# Patient Record
Sex: Female | Born: 1955
Health system: Southern US, Community
[De-identification: ages and names within clinical notes are randomized; demographics above are authoritative.]

## PROBLEM LIST (undated history)

## (undated) DIAGNOSIS — C50919 Malignant neoplasm of unspecified site of unspecified female breast: Secondary | ICD-10-CM

## (undated) DIAGNOSIS — G43909 Migraine, unspecified, not intractable, without status migrainosus: Secondary | ICD-10-CM

## (undated) HISTORY — PX: AUGMENTATION MAMMAPLASTY: SUR837

## (undated) HISTORY — PX: APPENDECTOMY: SHX54

## (undated) HISTORY — PX: BREAST LUMPECTOMY: SHX2

## (undated) HISTORY — DX: Malignant neoplasm of unspecified site of unspecified female breast: C50.919

## (undated) HISTORY — PX: BLADDER SUSPENSION: SHX72

## (undated) HISTORY — PX: NOSE SURGERY: SHX723

## (undated) HISTORY — DX: Migraine, unspecified, not intractable, without status migrainosus: G43.909

---

## 1998-01-29 ENCOUNTER — Emergency Department (HOSPITAL_COMMUNITY): Admission: EM | Admit: 1998-01-29 | Discharge: 1998-01-30 | Payer: Self-pay | Admitting: Emergency Medicine

## 1999-01-06 ENCOUNTER — Other Ambulatory Visit: Admission: RE | Admit: 1999-01-06 | Discharge: 1999-01-06 | Payer: Self-pay | Admitting: Gynecology

## 2000-10-13 ENCOUNTER — Other Ambulatory Visit: Admission: RE | Admit: 2000-10-13 | Discharge: 2000-10-13 | Payer: Self-pay | Admitting: Gynecology

## 2002-04-09 ENCOUNTER — Other Ambulatory Visit: Admission: RE | Admit: 2002-04-09 | Discharge: 2002-04-09 | Payer: Self-pay | Admitting: Gynecology

## 2003-08-14 ENCOUNTER — Other Ambulatory Visit: Admission: RE | Admit: 2003-08-14 | Discharge: 2003-08-14 | Payer: Self-pay | Admitting: Gynecology

## 2003-10-25 ENCOUNTER — Encounter (INDEPENDENT_AMBULATORY_CARE_PROVIDER_SITE_OTHER): Payer: Self-pay | Admitting: *Deleted

## 2003-10-25 ENCOUNTER — Ambulatory Visit (HOSPITAL_BASED_OUTPATIENT_CLINIC_OR_DEPARTMENT_OTHER): Admission: RE | Admit: 2003-10-25 | Discharge: 2003-10-25 | Payer: Self-pay | Admitting: Urology

## 2003-10-25 ENCOUNTER — Ambulatory Visit (HOSPITAL_COMMUNITY): Admission: RE | Admit: 2003-10-25 | Discharge: 2003-10-25 | Payer: Self-pay | Admitting: Urology

## 2005-02-02 ENCOUNTER — Emergency Department (HOSPITAL_COMMUNITY): Admission: EM | Admit: 2005-02-02 | Discharge: 2005-02-02 | Payer: Self-pay | Admitting: Emergency Medicine

## 2005-03-27 ENCOUNTER — Ambulatory Visit (HOSPITAL_COMMUNITY): Admission: RE | Admit: 2005-03-27 | Discharge: 2005-03-27 | Payer: Self-pay | Admitting: Family Medicine

## 2006-02-16 ENCOUNTER — Other Ambulatory Visit: Admission: RE | Admit: 2006-02-16 | Discharge: 2006-02-16 | Payer: Self-pay | Admitting: Gynecology

## 2006-08-23 HISTORY — PX: CERVICAL BIOPSY  W/ LOOP ELECTRODE EXCISION: SUR135

## 2007-02-27 ENCOUNTER — Other Ambulatory Visit: Admission: RE | Admit: 2007-02-27 | Discharge: 2007-02-27 | Payer: Self-pay | Admitting: Gynecology

## 2007-10-20 ENCOUNTER — Other Ambulatory Visit: Admission: RE | Admit: 2007-10-20 | Discharge: 2007-10-20 | Payer: Self-pay | Admitting: Gynecology

## 2008-01-25 ENCOUNTER — Observation Stay (HOSPITAL_COMMUNITY): Admission: EM | Admit: 2008-01-25 | Discharge: 2008-01-26 | Payer: Self-pay | Admitting: Orthopedic Surgery

## 2008-01-26 ENCOUNTER — Ambulatory Visit: Payer: Self-pay | Admitting: Vascular Surgery

## 2008-01-26 ENCOUNTER — Encounter (INDEPENDENT_AMBULATORY_CARE_PROVIDER_SITE_OTHER): Payer: Self-pay | Admitting: Internal Medicine

## 2008-02-07 ENCOUNTER — Ambulatory Visit (HOSPITAL_COMMUNITY): Admission: RE | Admit: 2008-02-07 | Discharge: 2008-02-07 | Payer: Self-pay | Admitting: Family Medicine

## 2008-03-26 ENCOUNTER — Other Ambulatory Visit: Admission: RE | Admit: 2008-03-26 | Discharge: 2008-03-26 | Payer: Self-pay | Admitting: Gynecology

## 2009-04-10 ENCOUNTER — Ambulatory Visit: Payer: Self-pay | Admitting: Gynecology

## 2009-04-10 ENCOUNTER — Other Ambulatory Visit: Admission: RE | Admit: 2009-04-10 | Discharge: 2009-04-10 | Payer: Self-pay | Admitting: Gynecology

## 2009-04-10 ENCOUNTER — Encounter: Payer: Self-pay | Admitting: Gynecology

## 2009-09-16 ENCOUNTER — Ambulatory Visit: Payer: Self-pay | Admitting: Gynecology

## 2009-10-29 ENCOUNTER — Ambulatory Visit: Payer: Self-pay | Admitting: Gynecology

## 2010-03-27 ENCOUNTER — Ambulatory Visit: Payer: Self-pay | Admitting: Gynecology
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
## 2011-01-05 NOTE — Procedures (Signed)
EEG NUMBER:  [id number].   REFERRING PHYSICIAN:  Dr. Ladene Artist.   This is a 55 year old woman with an episode of syncope, passed out at  home in the bathroom, unclear if it was seizure or syncope.  The patient  was found by husband, gasping, limb, eyes rolled back, and head not  responding to stimuli, regaining consciousness, loss of consciousness  was estimated 15-20 minutes.   MEDICATIONS LISTED:  K-Dur, Protonix, aspirin, and Lovenox.   This was a routine 17-channel EEG with one channel devoted to EKG,  utilizing [employer] 10/20 lead placement system.  The patient was  described as awake and asleep throughout the study clinically.  Electrographically, the patient appeared to be in waking, drowsy, and  light natural sleep states.  While awake the background consisted of a  poorly organized, fairly well-developed and well-modulated 10 Hz alpha  activity predominant in the posterior head regions and reactive to eye  opening.  During drowsiness, there was attenuation of the background  with decreased frequency and amplitude with onset of sleep, some central  sharp activity was seen, beta sleep spindles were noted and some K-  complexes were noted.  No interhemispheric asymmetry is identified.  No  definite epileptiform discharges were seen.  Hyperventilation was  performed and did not produce any significant change in the background  activity.  Photic stimulation did produce some occipital photic driving  at several flash frequencies.  The EKG monitor reveals relatively  regular rhythm with a rate of 72 beats per minute.   CONCLUSION:  Essentially normal awake, drowsy, and light natural sleep  EEG without seizure activity or focal abnormalities seen during the  course of today's recording.  Clinical correlation is recommended.      Catherine A. Orlin Hilding, M.D.  Electronically Signed     ZOX:WRUE  D:  01/26/2008 16:22:45  T:  01/27/2008 02:45:47  Job #:  [id number]
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
## 2011-01-08 NOTE — Op Note (Signed)
Kayla Rogers, Kayla Rogers                           ACCOUNT NO.:  [id number]   MEDICAL RECORD NO.:  [id number]                   PATIENT TYPE:  AMB   LOCATION:  NESC                                 FACILITY:  [facility]   PHYSICIAN:  Kayla Rogers, M.D.         DATE OF BIRTH:  11-28-1955   DATE OF PROCEDURE:  10/25/2003  DATE OF DISCHARGE:                                 OPERATIVE REPORT   PREOPERATIVE DIAGNOSIS:  Stress urinary incontinence.   POSTOPERATIVE DIAGNOSIS:  Stress urinary incontinence.   OPERATION:  Mentor transvaginal pubovaginal sling.   SURGEON:  Kayla Rogers, M.D.   ANESTHESIA:  General endotracheal.   PREPARATION:  After appropriate preanesthesia, the patient is brought to the  operating room and placed on the operating table in the dorsal supine  position where general LMA anesthesia was introduced.  She was then re-  placed dorsal lithotomy position.  Pubis was prepped with Betadine solution  and draped in the usual fashion.   DESCRIPTION OF PROCEDURE:  Posterior weighted speculum was placed, and 3 mL  of Marcaine 0.5% plain was injected over the urethra.  A 1.5 cm incision was  made over the urethra and subcutaneous tissue dissected bilaterally to the  level of retropubic fascia.  Two separate stab wounds were then made 5 cm  lateral to the clitoris.  Mentor transobturator pubovaginal sling was placed  in a standard fashion.  The tape was tensioned correctly.  Cystoscopy  revealed no evidence of any bladder involvement.  With a right-angle clamp  behind the tape, the wings were cut subcutaneously.  Wound was closed in two  layers with 3-0 Vicryl suture.  Irrigation was accomplished.  The patient  then underwent axillary node biopsy per Dr. Wenda Low, and this will be  dictated separately.  She had a B&O suppository and IV Toradol.  She was  awakened and taken to the recovery room after Dr. Ermalene Searing procedure.             Kayla Rogers, M.D.    SIT/MEDQ  D:  10/25/2003  T:  10/25/2003  Job:  [id number]

## 2011-01-08 NOTE — Op Note (Signed)
[name], Kayla Rogers                           ACCOUNT NO.:  [id number]   MEDICAL RECORD NO.:  [id number]                   PATIENT TYPE:  AMB   LOCATION:  [facility]                                 FACILITY:  [facility]   PHYSICIAN:  Thornton Park. Daphine Deutscher, M.D.             DATE OF BIRTH:  [birth year]   DATE OF PROCEDURE:  10/25/2003  DATE OF DISCHARGE:                                 OPERATIVE REPORT   PREOPERATIVE DIAGNOSIS:  Left axillary node palpable.   PROCEDURE:  Left axillary node biopsy.   SURGEON:  Thornton Park. Daphine Deutscher, M.D.   ANESTHESIA:  General.   DESCRIPTION OF PROCEDURE:  After [name] had undergone the sling procedure  for stress incontinence by Dr. Patsi Sears, I came in after she was already  asleep and palpated the area in her left axilla and the rubbery node in the  area previously described in my note beneath her left pectoral muscle.  I  made a small incision in the skin lines within the hair line and carried  this down deep through the fascia.  I then palpated the rubbery nodule and  then dissected it from the surrounding tissue.  I stayed down right on the  node, and I used small clips to isolate the vascular and lymphatics applied  to this node.  I did not need to cut any structure that looked like a nerve  or any vascular structures and wanted to do that to minimize postoperative  dysesthesias which I had informed her about as a potential risk for this  procedure.  The node was then delivered en toto and sent for touch preps and  node exam.  The wound was then closed with 4-0 Vicryl with Benzoin and Steri-  Strips.  The patient tolerated the procedure well and was taken to the  recovery room in satisfactory condition.                                               Thornton Park Daphine Deutscher, M.D.    MBM/MEDQ  D:  10/25/2003  T:  10/25/2003  Job:  [id number]   cc:   Lynelle Smoke I. Patsi Sears, M.D.  [address]  [place]  [place] [address]  Fax: [phone]   Nadyne Coombes.  Audie Box, M.D.  [address]  [place]  [place] [address]  Fax: [phone]   [facility]

## 2011-01-14 ENCOUNTER — Other Ambulatory Visit (HOSPITAL_COMMUNITY)
Admission: RE | Admit: 2011-01-14 | Discharge: 2011-01-14 | Disposition: A | Discharge status: Home / Self Care | Payer: BC Managed Care – PPO | Source: Ambulatory Visit | Attending: Gynecology | Admitting: Gynecology

## 2011-01-14 ENCOUNTER — Encounter (INDEPENDENT_AMBULATORY_CARE_PROVIDER_SITE_OTHER): Payer: BC Managed Care – PPO | Admitting: Gynecology

## 2011-01-14 ENCOUNTER — Other Ambulatory Visit: Payer: Self-pay | Admitting: Gynecology

## 2011-01-14 DIAGNOSIS — N926 Irregular menstruation, unspecified: Secondary | ICD-10-CM

## 2011-01-14 DIAGNOSIS — Z833 Family history of diabetes mellitus: Secondary | ICD-10-CM

## 2011-01-14 DIAGNOSIS — Z13220 Encounter for screening for lipoid disorders: Secondary | ICD-10-CM

## 2011-01-14 DIAGNOSIS — Z124 Encounter for screening for malignant neoplasm of cervix: Secondary | ICD-10-CM | POA: Insufficient documentation

## 2011-01-14 DIAGNOSIS — Z01419 Encounter for gynecological examination (general) (routine) without abnormal findings: Secondary | ICD-10-CM

## 2011-03-26 ENCOUNTER — Telehealth: Payer: Self-pay | Admitting: *Deleted

## 2011-03-26 DIAGNOSIS — N926 Irregular menstruation, unspecified: Secondary | ICD-10-CM

## 2011-03-26 MED ORDER — MEDROXYPROGESTERONE ACETATE 10 MG PO TABS: 10.0000 mg | ORAL_TABLET | Freq: Two times a day (BID) | ORAL | Status: DC

## 2011-03-26 NOTE — Telephone Encounter (Signed)
PT INFORMED WITH THE BELOW NOTE AND TO FOLLOW UP IF BLEEDING CONTINUES.

## 2011-03-26 NOTE — Telephone Encounter (Signed)
Tell patient we will withdraw her on Provera 10 mg twice a day x5 days assuming period and his and we'll watch if continued bleeding been present for office visit

## 2011-03-26 NOTE — Telephone Encounter (Signed)
PT CALLING C/O BLEEDING SINCE 03/10/11 LMP. PT WAS TOLD TO FOLLOW UP RE: IRREGULAR MENSES. PLEASE ADVISE.

## 2011-05-20 LAB — LIPID PANEL
Cholesterol: 129
LDL Cholesterol: 74
Total CHOL/HDL Ratio: 2.7

## 2011-05-20 LAB — URINE CULTURE

## 2011-05-20 LAB — PROTIME-INR
INR: 1
Prothrombin Time: 12.7
Prothrombin Time: 13.4

## 2011-05-20 LAB — DIFFERENTIAL
Basophils Absolute: 0.1
Basophils Relative: 2 — ABNORMAL HIGH
Lymphs Abs: 2.2
Neutro Abs: 5.9
Neutrophils Relative %: 66

## 2011-05-20 LAB — POCT I-STAT, CHEM 8
Chloride: 107
HCT: 40
Hemoglobin: 13.6
Potassium: 3.3 — ABNORMAL LOW
TCO2: 18

## 2011-05-20 LAB — SEDIMENTATION RATE: Sed Rate: 5

## 2011-05-20 LAB — COMPREHENSIVE METABOLIC PANEL
ALT: 10
Alkaline Phosphatase: 37 — ABNORMAL LOW
BUN: 9
CO2: 24
Chloride: 113 — ABNORMAL HIGH
GFR calc non Af Amer: >60
Glucose, Bld: 90
Potassium: 3.6
Sodium: 141
Total Bilirubin: 0.7
Total Protein: 5.9 — ABNORMAL LOW

## 2011-05-20 LAB — CARDIAC PANEL(CRET KIN+CKTOT+MB+TROPI)
CK, MB: 0.9
Relative Index: INVALID
Troponin I: 0.03

## 2011-05-20 LAB — CBC
HCT: 36
Hemoglobin: 12.3
MCV: 91.2
Platelets: 278
Platelets: 342
RDW: 12.8
WBC: 6.2
WBC: 9

## 2011-05-20 LAB — RAPID URINE DRUG SCREEN, HOSP PERFORMED
Barbiturates: NOT DETECTED
Benzodiazepines: NOT DETECTED
Cocaine: NOT DETECTED

## 2011-05-20 LAB — APTT
aPTT: 33
aPTT: 34

## 2011-05-20 LAB — HEMOGLOBIN A1C: Mean Plasma Glucose: 104

## 2011-05-20 LAB — CK TOTAL AND CKMB (NOT AT ARMC)
CK, MB: 1.2
Relative Index: INVALID

## 2011-05-20 LAB — HEPATIC FUNCTION PANEL
ALT: 10
AST: 16

## 2011-05-20 LAB — RPR: RPR Ser Ql: NONREACTIVE

## 2011-05-20 LAB — POCT CARDIAC MARKERS
CKMB, poc: <1 — ABNORMAL LOW
Myoglobin, poc: 77.1
Operator id: 270651

## 2011-05-20 LAB — VITAMIN B12: Vitamin B-12: 281 (ref 211–911)

## 2011-05-20 LAB — PHOSPHORUS: Phosphorus: 3.3

## 2011-05-20 LAB — TRICYCLICS SCREEN, URINE: TCA Scrn: NOT DETECTED

## 2011-05-20 LAB — URINALYSIS, ROUTINE W REFLEX MICROSCOPIC
Bilirubin Urine: NEGATIVE
Glucose, UA: NEGATIVE
Hgb urine dipstick: NEGATIVE
Protein, ur: NEGATIVE
Urobilinogen, UA: 0.2

## 2011-05-20 LAB — HOMOCYSTEINE: Homocysteine: 11.4

## 2011-05-20 LAB — ETHANOL: Alcohol, Ethyl (B): <5

## 2011-05-20 LAB — D-DIMER, QUANTITATIVE: D-Dimer, Quant: 0.33

## 2011-05-20 LAB — LACTIC ACID, PLASMA: Lactic Acid, Venous: 0.6

## 2011-05-20 LAB — SALICYLATE LEVEL: Salicylate Lvl: <4

## 2011-06-01 ENCOUNTER — Ambulatory Visit (INDEPENDENT_AMBULATORY_CARE_PROVIDER_SITE_OTHER): Payer: BC Managed Care – PPO | Admitting: Gynecology

## 2011-06-01 DIAGNOSIS — B373 Candidiasis of vulva and vagina: Secondary | ICD-10-CM

## 2011-06-01 DIAGNOSIS — A499 Bacterial infection, unspecified: Secondary | ICD-10-CM

## 2011-06-01 DIAGNOSIS — N898 Other specified noninflammatory disorders of vagina: Secondary | ICD-10-CM

## 2011-06-01 DIAGNOSIS — B9689 Other specified bacterial agents as the cause of diseases classified elsewhere: Secondary | ICD-10-CM

## 2011-06-01 DIAGNOSIS — Z113 Encounter for screening for infections with a predominantly sexual mode of transmission: Secondary | ICD-10-CM

## 2011-06-01 DIAGNOSIS — R350 Frequency of micturition: Secondary | ICD-10-CM

## 2011-06-01 DIAGNOSIS — N760 Acute vaginitis: Secondary | ICD-10-CM

## 2011-06-01 NOTE — Progress Notes (Signed)
Patient is a 55 year old who presented to the office today complaining of watery vaginal discharge for the past few days. She was concerned about the possibility of an STD due to the fact that her husband had been unfaithful. He has a vasectomy and she still having normal menstrual cycles.  Pelvic: Bartholin urethra Skene glands: Within normal limits Vagina: Watery fishy odor discharge Cervix: No gross lesions on inspection  Wet prep demonstrated evidence of bacterial vaginosis and yeast. GC and chlamydia culture were obtained as well as an HIV RPR hepatitis B and hepatitis C.  Prescription for Cleocin vaginal cream to apply each bedtime for 5 days as well as Diflucan 150 mg one by mouth. Will notify patient within the next few days if there is any abnormality of any of the above mentioned to.

## 2011-06-01 NOTE — Patient Instructions (Signed)
Will call you within the next 48 hours if there is any abnormality in any of the test drawn today.

## 2011-06-02 LAB — RPR

## 2012-01-18 ENCOUNTER — Ambulatory Visit (INDEPENDENT_AMBULATORY_CARE_PROVIDER_SITE_OTHER): Payer: BC Managed Care – PPO | Admitting: Gynecology

## 2012-01-18 ENCOUNTER — Encounter: Payer: Self-pay | Admitting: Gynecology

## 2012-01-18 VITALS — BP 116/70 | Ht 66.0 in | Wt 150.5 lb

## 2012-01-18 DIAGNOSIS — Z01419 Encounter for gynecological examination (general) (routine) without abnormal findings: Secondary | ICD-10-CM

## 2012-01-18 DIAGNOSIS — N926 Irregular menstruation, unspecified: Secondary | ICD-10-CM

## 2012-01-18 NOTE — Progress Notes (Signed)
Kayla Rogers 02/17/1956 [id number]        56 y.o.  for annual exam.  Several issues noted below.  Past medical history,surgical history, medications, allergies, family history and social history were all reviewed and documented in the EPIC chart. ROS:  Was performed and pertinent positives and negatives are included in the history.  Exam: Sherrilyn Rist chaperone present Filed Vitals:   01/18/12 1605  BP: 116/70   General appearance  Normal Skin grossly normal Head/Neck normal with no cervical or supraclavicular adenopathy thyroid normal Lungs  clear Cardiac RR, without RMG Abdominal  soft, nontender, without masses, organomegaly or hernia Breasts  examined lying and sitting without masses, retractions, discharge or axillary adenopathy.  Bilateral implants noted. Pelvic  Ext/BUS/vagina  normal mild atrophic changes.  Cervix  normal   Uterus  retroverted, normal size, shape and contour, midline and mobile nontender   Adnexa  Without masses or tenderness    Anus and perineum  normal   Rectovaginal  normal sphincter tone without palpated masses or tenderness.    Assessment/Plan:  56 y.o. female for annual exam.    1. Irregular menses. Patient has skipped up to 6 months without menses. Her last menses was April 2013. She's not having prolonged or atypical bleeding. She did have some hot flashes/night sweats initially but these seem to have resolved also. We'll check baseline FSH as her FSH last year was 12. Assuming elevated, she will keep menstrual calendar and as long as she has less frequent but normal menses we'll follow. If she has prolonged or atypical bleeding she knows to report this or if she goes more than one year without menses and then bleeds. 2. Mammography. Patient is due for mammography now noticed to schedule this. SBE monthly reviewed. 3. Pap smear. She has a history of ASCUS-H 2008. 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 Her yearly Pap smears have been all  negative last Pap smear 2012. No Pap smear was done this year I discussed less frequent screening per current screening guidelines at 3 to five-year intervals and she agrees. 4. DEXA. We'll plan 5 years into her full menopause. Increase calcium vitamin D reviewed. No overt risk factors. 5. Colonoscopy. She's never had a colonoscopy. Her father did have colon cancer. I recommended screening colonoscopy now and give her names of gastroenterology groups in town and she knows to call and schedule and the importance to do so. 6. Health maintenance. Patient had normal glucose, lipid profile and CBC last year and these were not repeated this year.    Dara Lords MD, 4:35 PM 01/18/2012

## 2012-01-18 NOTE — Patient Instructions (Signed)
Keep menstrual calendar. As long as less frequent but regular menses and monitor. If prolonged or atypical bleeding then call the office. Otherwise follow up in one year for annual gynecologic exam.  Schedule mammogram. Schedule colonoscopy.

## 2012-01-19 LAB — URINALYSIS W MICROSCOPIC + REFLEX CULTURE
Bilirubin Urine: NEGATIVE
Casts: NONE SEEN
Crystals: NONE SEEN
Nitrite: NEGATIVE
Specific Gravity, Urine: 1.008 (ref 1.005–1.030)
Squamous Epithelial / LPF: NONE SEEN
Urobilinogen, UA: 0.2 mg/dL (ref 0.0–1.0)
pH: 6.5 (ref 5.0–8.0)

## 2012-09-08 ENCOUNTER — Encounter: Payer: Self-pay | Admitting: Gynecology

## 2012-09-08 ENCOUNTER — Ambulatory Visit (INDEPENDENT_AMBULATORY_CARE_PROVIDER_SITE_OTHER): Payer: BC Managed Care – PPO | Admitting: Gynecology

## 2012-09-08 DIAGNOSIS — A499 Bacterial infection, unspecified: Secondary | ICD-10-CM

## 2012-09-08 DIAGNOSIS — N393 Stress incontinence (female) (male): Secondary | ICD-10-CM

## 2012-09-08 DIAGNOSIS — N9089 Other specified noninflammatory disorders of vulva and perineum: Secondary | ICD-10-CM

## 2012-09-08 DIAGNOSIS — B9689 Other specified bacterial agents as the cause of diseases classified elsewhere: Secondary | ICD-10-CM

## 2012-09-08 DIAGNOSIS — N760 Acute vaginitis: Secondary | ICD-10-CM

## 2012-09-08 DIAGNOSIS — N912 Amenorrhea, unspecified: Secondary | ICD-10-CM

## 2012-09-08 DIAGNOSIS — N898 Other specified noninflammatory disorders of vagina: Secondary | ICD-10-CM

## 2012-09-08 LAB — WET PREP FOR TRICH, YEAST, CLUE

## 2012-09-08 MED ORDER — METRONIDAZOLE 500 MG PO TABS: 500.0000 mg | ORAL_TABLET | Freq: Two times a day (BID) | ORAL | Status: DC

## 2012-09-08 NOTE — Progress Notes (Signed)
Patient presents with several issues: 1. Vaginal discharge over the last several weeks. No odor or itching. No urinary symptoms such as frequency dysuria or urgency. 2. LMP May 2013, not bleeding otherwise. Some mild hot flashes and sweats.  3. Small bump on her left vulva that she's noticed over the last several weeks. Nonpainful seems to be actually getting smaller. 4. History of classic SUI symptoms with loss of urine with laughing coughing sneezing and using the trampoline. Prior sling procedure number of years ago by urologist that she cannot remember the name. Seems to getting worse over the past 6 months.  Exam was kim assistant Abdomen soft nontender without masses guarding rebound organomegaly Pelvic external BUS vagina with abundant white discharge.  No gross evidence of cystocele.  Small classic sebaceous cyst left mid labia majora.  Cervix normal. Uterus retroverted grossly normal in size midline mobile nontender. Adnexa without masses or tenderness.  Assessment and plan: 1. Wet prep and symptoms consistent with bacterial vaginosis. Treat with Flagyl 500 mg twice a day x7 days, alcohol avoidance reviewed. 2. Perimenopausal with LEEP in May. Mild hot flushes and night sweats. Options for treatment reviewed to include expectant management, OTC soy based, pharmacologic nonhormonal such as Effexor were, HRT. Patient plans observation at this time and her choice. I reviewed if she does any prolonged or atypical bleeding or bleeds after one year of amenorrhea she is to call me. 3. Sebaceous cyst left labia. Classic in appearance. Noninflammatory. Patient will observe as long as it remains stable or resolved she'll follow. If it enlarges becomes uncomfortable or changes she knows to represent. 4. Recurrent SUI status post sling procedure. I recommended she call urology and make an appointment to see them for evaluation and treatment. Patient agrees to do so.

## 2012-09-08 NOTE — Patient Instructions (Signed)
Call urology to follow up as far as the urinary incontinence. Let me know if you have any issues arranging this. Take Flagyl medication twice daily for 7 days. Avoid alcohol while taking. Follow up in May 2014 for your annual exam.

## 2012-09-11 ENCOUNTER — Encounter: Payer: Self-pay | Admitting: Gynecology

## 2013-01-24 ENCOUNTER — Ambulatory Visit (INDEPENDENT_AMBULATORY_CARE_PROVIDER_SITE_OTHER): Payer: BC Managed Care – PPO | Admitting: Gynecology

## 2013-01-24 ENCOUNTER — Encounter: Payer: Self-pay | Admitting: Gynecology

## 2013-01-24 VITALS — BP 114/72 | Ht 65.75 in | Wt 163.0 lb

## 2013-01-24 DIAGNOSIS — Z8261 Family history of arthritis: Secondary | ICD-10-CM

## 2013-01-24 DIAGNOSIS — N951 Menopausal and female climacteric states: Secondary | ICD-10-CM

## 2013-01-24 DIAGNOSIS — Z13220 Encounter for screening for lipoid disorders: Secondary | ICD-10-CM

## 2013-01-24 DIAGNOSIS — Z01419 Encounter for gynecological examination (general) (routine) without abnormal findings: Secondary | ICD-10-CM

## 2013-01-24 LAB — LIPID PANEL
Cholesterol: 146 mg/dL (ref 0–200)
LDL Cholesterol: 72 mg/dL (ref 0–99)
Triglycerides: 81 mg/dL (ref <150)

## 2013-01-24 LAB — COMPREHENSIVE METABOLIC PANEL
Albumin: 4.5 g/dL (ref 3.5–5.2)
Alkaline Phosphatase: 76 U/L (ref 39–117)
CO2: 24 mEq/L (ref 19–32)
Calcium: 10.3 mg/dL (ref 8.4–10.5)
Chloride: 107 mEq/L (ref 96–112)
Glucose, Bld: 85 mg/dL (ref 70–99)
Potassium: 4 mEq/L (ref 3.5–5.3)
Sodium: 137 mEq/L (ref 135–145)
Total Protein: 7.1 g/dL (ref 6.0–8.3)

## 2013-01-24 LAB — CBC WITH DIFFERENTIAL/PLATELET
HCT: 39.5 % (ref 36.0–46.0)
Hemoglobin: 13.8 g/dL (ref 12.0–15.0)
Lymphocytes Relative: 37 % (ref 12–46)
Lymphs Abs: 2 10*3/uL (ref 0.7–4.0)
Monocytes Relative: 11 % (ref 3–12)
Neutro Abs: 2.7 10*3/uL (ref 1.7–7.7)
Neutrophils Relative %: 50 % (ref 43–77)
RBC: 4.65 MIL/uL (ref 3.87–5.11)

## 2013-01-24 MED ORDER — ESTRADIOL 0.1 MG/24HR TD PTTW: 1.0000 | MEDICATED_PATCH | TRANSDERMAL | Status: DC

## 2013-01-24 MED ORDER — PROGESTERONE MICRONIZED 100 MG PO CAPS: 100.0000 mg | ORAL_CAPSULE | Freq: Every day | ORAL | Status: DC

## 2013-01-24 NOTE — Progress Notes (Signed)
JEMMIE LEDGERWOOD 23-Sep-1955 [id number]        57 y.o.  [id number] for annual exam.  Several issues noted below.  Past medical history,surgical history, medications, allergies, family history and social history were all reviewed and documented in the EPIC chart.  ROS:  Performed and pertinent positives and negatives are included in the history, assessment and plan .  Exam: Sherrilyn Rist assistant Filed Vitals:   01/24/13 1603  BP: 114/72  Height: 5' 5.75" (1.67 m)  Weight: 163 lb (73.936 kg)   General appearance  Normal Skin grossly normal Head/Neck normal with no cervical or supraclavicular adenopathy thyroid normal Lungs  clear Cardiac RR, without RMG Abdominal  soft, nontender, without masses, organomegaly or hernia Breasts  examined lying and sitting without masses, retractions, discharge or axillary adenopathy. Bilateral implants noted Pelvic  Ext/BUS/vagina  normal   Cervix  normal   Uterus  retroverted, normal size, shape and contour, midline and mobile nontender   Adnexa  Without masses or tenderness    Anus and perineum  normal   Rectovaginal  normal sphincter tone without palpated masses or tenderness.    Assessment/Plan:  57 y.o. [id number] female for annual exam, vasectomy birth control.   1. Menopausal symptoms. Patient is over one year without menses. Has started to develop hot flushes night sweats weight gain mood swings. Finding these to be unacceptable. I reviewed options to include HRT. I discussed the WHI study with increased risk of stroke heart attack DVT and breast cancer. The ACOG and NAMS statement for lowest dose for shortest period of time. Transdermal versus oral benefits/first pass effect. Patient wants to try. Minivelle 0.1 mg and Prometrium 100 mg nightly prescribed. If gets good relief and will follow. Any questions or issues she'll call me. Patient knows to call if she does any bleeding. 2. Strong family history of arthritis in siblings and parents. No identifiable  diagnoses. Probable DJD. We'll check baseline ANA and rheumatoid factor. 3. Pap smear 2012. No Pap smear done today. History of LEEP for CIN clear margin 2008. Negative Pap smears since then. Plan repeat Pap smear next year at 3 year interval. 4. Mammography 2012. Patient knows she is over due. Patient will schedule. SBE monthly reviewed. 5. Bone density never. We'll plan to age 83. Increase calcium vitamin D reviewed. Check vitamin D level today. 6. Colonoscopy never. Had advised in the past. I again reviewed my recommendation to schedule colonoscopy now she agrees to do so. 7. Health maintenance. Baseline CBC comprehensive metabolic panel lipid profile TSH vitamin D urinalysis ANA and rheumatoid factor ordered. Followup in one year, sooner if any issues with HRT.    Dara Lords MD, 5:05 PM 01/24/2013

## 2013-01-24 NOTE — Patient Instructions (Signed)
Start on patches twice weekly. Progesterone nightly. Call me if you have any issues or bleeding. Schedule colonoscopy. Schedule mammography. Followup in one year for annual exam.

## 2013-01-25 LAB — RHEUMATOID FACTOR: Rhuematoid fact SerPl-aCnc: <10 IU/mL (ref <=14)

## 2013-01-25 LAB — URINALYSIS W MICROSCOPIC + REFLEX CULTURE
Bilirubin Urine: NEGATIVE
Specific Gravity, Urine: 1.021 (ref 1.005–1.030)
Urobilinogen, UA: 0.2 mg/dL (ref 0.0–1.0)

## 2013-01-26 LAB — URINE CULTURE: Colony Count: 2000

## 2014-06-24 ENCOUNTER — Encounter: Payer: Self-pay | Admitting: Gynecology

## 2015-04-24 ENCOUNTER — Other Ambulatory Visit: Payer: Self-pay | Admitting: Family Medicine

## 2015-04-24 DIAGNOSIS — M79662 Pain in left lower leg: Principal | ICD-10-CM

## 2015-04-24 DIAGNOSIS — M79661 Pain in right lower leg: Secondary | ICD-10-CM

## 2015-05-02 ENCOUNTER — Ambulatory Visit
Admission: RE | Admit: 2015-05-02 | Discharge: 2015-05-02 | Disposition: A | Discharge status: Home / Self Care | Payer: BLUE CROSS/BLUE SHIELD | Source: Ambulatory Visit | Attending: Family Medicine | Admitting: Family Medicine

## 2015-05-02 DIAGNOSIS — M79661 Pain in right lower leg: Secondary | ICD-10-CM

## 2015-05-02 DIAGNOSIS — M79662 Pain in left lower leg: Principal | ICD-10-CM

## 2015-06-25 ENCOUNTER — Encounter: Payer: Self-pay | Admitting: Gynecology

## 2015-06-25 ENCOUNTER — Other Ambulatory Visit (HOSPITAL_COMMUNITY)
Admission: RE | Admit: 2015-06-25 | Discharge: 2015-06-25 | Disposition: A | Discharge status: Home / Self Care | Payer: BLUE CROSS/BLUE SHIELD | Source: Ambulatory Visit | Attending: Gynecology | Admitting: Gynecology

## 2015-06-25 ENCOUNTER — Ambulatory Visit (INDEPENDENT_AMBULATORY_CARE_PROVIDER_SITE_OTHER): Payer: BLUE CROSS/BLUE SHIELD | Admitting: Gynecology

## 2015-06-25 VITALS — BP 122/76 | Ht 66.0 in | Wt 162.0 lb

## 2015-06-25 DIAGNOSIS — Z1151 Encounter for screening for human papillomavirus (HPV): Secondary | ICD-10-CM | POA: Diagnosis present

## 2015-06-25 DIAGNOSIS — Z01419 Encounter for gynecological examination (general) (routine) without abnormal findings: Secondary | ICD-10-CM

## 2015-06-25 DIAGNOSIS — Z01411 Encounter for gynecological examination (general) (routine) with abnormal findings: Secondary | ICD-10-CM | POA: Insufficient documentation

## 2015-06-25 NOTE — Addendum Note (Signed)
Addended by: Dayna BarkerGARDNER, Tiersa Dayley K on: 06/25/2015 03:49 PM   Modules accepted: Orders

## 2015-06-25 NOTE — Progress Notes (Signed)
Kayla Rogers 08/22/1956 [id number]        59 y.o.  [id number]  Patient's last menstrual period was 12/22/2011. for annual exam.  Doing well.  Past medical history,surgical history, problem list, medications, allergies, family history and social history were all reviewed and documented as reviewed in the EPIC chart.  ROS:  Performed with pertinent positives and negatives included in the history, assessment and plan.   Additional significant findings :  none   Exam: [facility] personassistant Filed Vitals:   06/25/15 1417  BP: 122/76  Height: 5\' 6"  (1.676 m)  Weight: 162 lb (73.483 kg)   General appearance:  Normal affect, orientation and appearance. Skin: Grossly normal HEENT: Without gross lesions.  No cervical or supraclavicular adenopathy. Thyroid normal.  Lungs:  Clear without wheezing, rales or rhonchi Cardiac: RR, without RMG Abdominal:  Soft, nontender, without masses, guarding, rebound, organomegaly or hernia Breasts:  Examined lying and sitting without masses, retractions, discharge or axillary adenopathy.  Bilateral implants noted. Pelvic:  Ext/BUS/vagina normal with mild atrophic changes  Cervix normal. Pap smear/HPV  Uterus anteverted, normal size, shape and contour, midline and mobile nontender   Adnexa  Without masses or tenderness    Anus and perineum  Normal   Rectovaginal  Normal sphincter tone without palpated masses or tenderness.    Assessment/Plan:  59 y.o. [id number] female for annual exam.   1. Postmenopausal/atrophic genital changes. Patient transiently tried HRT 2 years ago but had headaches and stopped it. She no longer is having bothersome hot flashes, night sweats, vaginal dryness. No vaginal bleeding. Prefers just monitor. Call if any issues or bleeding. 2. Pap smear 2012. Pap/HPV today.  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 Normal Pap smears afterwards. 3. Mammography 2008. I strongly recommended patient schedule a screening mammogram. She is way overdue  when she knows this. SBE monthly reviewed. 4. Colonoscopy 2016. Repeat at their recommended interval. 5. DEXA never. Will plan further into the menopause. 6. Health maintenance. Patient recently had full exam with lab work. No lab work done today. Follow up in one year, sooner as needed.   Kayla LordsFONTAINE,[name] MD, 2:37 PM 06/25/2015

## 2015-06-25 NOTE — Patient Instructions (Signed)
Call to Schedule your mammogram  Facilities in [place]: 1)  [facility], [address] Phone: [phone] 2)  [facility], [address] Phone: [phone] 3)  [facility] Bert[facility]  [address] Phone: [phone]     Mammogram A mammogram is an X-ray test to find changes in a woman's breast. You should get a mammogram if:  You are 40 years of age or older  You have risk factors.   Your doctor recommends that you have one.  BEFORE THE TEST  Do not schedule the test the week before your period, especially if your breasts are sore during this time.  On the day of your mammogram:  Wash your breasts and armpits well. After washing, do not put on any deodorant or talcum powder on until after your test.   Eat and drink as you usually do.   Take your medicines as usual.   If you are diabetic and take insulin, make sure you:   Eat before coming for your test.   Take your insulin as usual.   If you cannot keep your appointment, call before the appointment to cancel. Schedule another appointment.  TEST  You will need to undress from the waist up. You will put on a hospital gown.   Your breast will be put on the mammogram machine, and it will press firmly on your breast with a piece of plastic called a compression paddle. This will make your breast flatter so that the machine can X-ray all parts of your breast.   Both breasts will be X-rayed. Each breast will be X-rayed from above and from the side. An X-ray might need to be taken again if the picture is not good enough.   The mammogram will last about 15 to 30 minutes.  AFTER THE TEST Finding out the results of your test Ask when your test results will be ready. Make sure you get your test results.  Document Released: 11/05/2008 Document Revised: 07/29/2011 Document Reviewed: 11/05/2008 ExitCare Patient  Information 2012 [employer].  You may obtain a copy of any labs that were done today by logging onto MyChart as outlined in the instructions provided with your AVS (after visit summary). The office will not call with normal lab results but certainly if there are any significant abnormalities then we will contact you.   Health Maintenance Adopting a healthy lifestyle and getting preventive care can go a long way to promote health and wellness. Talk with your health care provider about what schedule of regular examinations is right for you. This is a good chance for you to check in with your provider about disease prevention and staying healthy. In between checkups, there are plenty of things you can do on your own. Experts have done a lot of research about which lifestyle changes and preventive measures are most likely to keep you healthy. Ask your health care provider for more information. WEIGHT AND DIET  Eat a healthy diet  Be sure to include plenty of vegetables, fruits, low-fat dairy products, and lean protein.  Do not eat a lot of foods high in solid fats, added sugars, or salt.  Get regular exercise. This is one of the most important things you can do for your health.  Most adults should exercise for at least 150 minutes each week. The exercise should increase your heart rate and make you sweat (moderate-intensity exercise).    Most adults should also do strengthening exercises at least twice a week. This is in addition to the moderate-intensity exercise.  Maintain a healthy weight  Body mass index (BMI) is a measurement that can be used to identify possible weight problems. It estimates body fat based on height and weight. Your health care provider can help determine your BMI and help you achieve or maintain a healthy weight.  For females 20 years of age and older:   A BMI below 18.5 is considered underweight.  A BMI of 18.5 to 24.9 is normal.  A BMI of 25 to 29.9 is  considered overweight.  A BMI of 30 and above is considered obese.  Watch levels of cholesterol and blood lipids  You should start having your blood tested for lipids and cholesterol at 57 years of age, then have this test every 5 years.  You may need to have your cholesterol levels checked more often if:  Your lipid or cholesterol levels are high.  You are older than 57 years of age.  You are at high risk for heart disease.  CANCER SCREENING   Lung Cancer  Lung cancer screening is recommended for adults 55-80 years old who are at high risk for lung cancer because of a history of smoking.  A yearly low-dose CT scan of the lungs is recommended for people who:  Currently smoke.  Have quit within the past 15 years.  Have at least a 30-pack-year history of smoking. A pack year is smoking an average of one pack of cigarettes a day for 1 year.  Yearly screening should continue until it has been 15 years since you quit.  Yearly screening should stop if you develop a health problem that would prevent you from having lung cancer treatment.  Breast Cancer  Practice breast self-awareness. This means understanding how your breasts normally appear and feel.  It also means doing regular breast self-exams. Let your health care provider know about any changes, no matter how small.  If you are in your 20s or 30s, you should have a clinical breast exam (CBE) by a health care provider every 1-3 years as part of a regular health exam.  If you are 40 or older, have a CBE every year. Also consider having a breast X-ray (mammogram) every year.  If you have a family history of breast cancer, talk to your health care provider about genetic screening.  If you are at high risk for breast cancer, talk to your health care provider about having an MRI and a mammogram every year.  Breast cancer gene (BRCA) assessment is recommended for women who have family members with BRCA-related cancers.  BRCA-related cancers include:  Breast.  Ovarian.  Tubal.  Peritoneal cancers.  Results of the assessment will determine the need for genetic counseling and BRCA1 and BRCA2 testing. Cervical Cancer Routine pelvic examinations to screen for cervical cancer are no longer recommended for nonpregnant women who are considered low risk for cancer of the pelvic organs (ovaries, uterus, and vagina) and who do not have symptoms. A pelvic examination may be necessary if you have symptoms including those associated with pelvic infections. Ask your health care provider if a screening pelvic exam is right for you.   The Pap test is the screening test for cervical cancer for women who are considered at risk.  If you had a hysterectomy for a problem that was not cancer or a condition that could lead to cancer, then you no longer need   Pap tests.  If you are older than 65 years, and you have had normal Pap tests for the past 10 years, you no longer need to have Pap tests.  If you have had past treatment for cervical cancer or a condition that could lead to cancer, you need Pap tests and screening for cancer for at least 20 years after your treatment.  If you no longer get a Pap test, assess your risk factors if they change (such as having a new sexual partner). This can affect whether you should start being screened again.  Some women have medical problems that increase their chance of getting cervical cancer. If this is the case for you, your health care provider may recommend more frequent screening and Pap tests.  The human papillomavirus (HPV) test is another test that may be used for cervical cancer screening. The HPV test looks for the virus that can cause cell changes in the cervix. The cells collected during the Pap test can be tested for HPV.  The HPV test can be used to screen women 30 years of age and older. Getting tested for HPV can extend the interval between normal Pap tests from three to  five years.  An HPV test also should be used to screen women of any age who have unclear Pap test results.  After 57 years of age, women should have HPV testing as often as Pap tests.  Colorectal Cancer  This type of cancer can be detected and often prevented.  Routine colorectal cancer screening usually begins at 57 years of age and continues through 57 years of age.  Your health care provider may recommend screening at an earlier age if you have risk factors for colon cancer.  Your health care provider may also recommend using home test kits to check for hidden blood in the stool.  A small camera at the end of a tube can be used to examine your colon directly (sigmoidoscopy or colonoscopy). This is done to check for the earliest forms of colorectal cancer.  Routine screening usually begins at age 50.  Direct examination of the colon should be repeated every 5-10 years through 57 years of age. However, you may need to be screened more often if early forms of precancerous polyps or small growths are found. Skin Cancer  Check your skin from head to toe regularly.  Tell your health care provider about any new moles or changes in moles, especially if there is a change in a mole's shape or color.  Also tell your health care provider if you have a mole that is larger than the size of a pencil eraser.  Always use sunscreen. Apply sunscreen liberally and repeatedly throughout the day.  Protect yourself by wearing long sleeves, pants, a wide-brimmed hat, and sunglasses whenever you are outside. HEART DISEASE, DIABETES, AND HIGH BLOOD PRESSURE   Have your blood pressure checked at least every 1-2 years. High blood pressure causes heart disease and increases the risk of stroke.  If you are between 55 years and 79 years old, ask your health care provider if you should take aspirin to prevent strokes.  Have regular diabetes screenings. This involves taking a blood sample to check your  fasting blood sugar level.  If you are at a normal weight and have a low risk for diabetes, have this test once every three years after 57 years of age.  If you are overweight and have a high risk for diabetes, consider being tested at   a younger age or more often. PREVENTING INFECTION  Hepatitis B  If you have a higher risk for hepatitis B, you should be screened for this virus. You are considered at high risk for hepatitis B if:  You were born in a country where hepatitis B is common. Ask your health care provider which countries are considered high risk.  Your parents were born in a high-risk country, and you have not been immunized against hepatitis B (hepatitis B vaccine).  You have HIV or AIDS.  You use needles to inject street drugs.  You live with someone who has hepatitis B.  You have had sex with someone who has hepatitis B.  You get hemodialysis treatment.  You take certain medicines for conditions, including cancer, organ transplantation, and autoimmune conditions. Hepatitis C  Blood testing is recommended for:  Everyone born from 1945 through 1965.  Anyone with known risk factors for hepatitis C. Sexually transmitted infections (STIs)  You should be screened for sexually transmitted infections (STIs) including gonorrhea and chlamydia if:  You are sexually active and are younger than 57 years of age.  You are older than 57 years of age and your health care provider tells you that you are at risk for this type of infection.  Your sexual activity has changed since you were last screened and you are at an increased risk for chlamydia or gonorrhea. Ask your health care provider if you are at risk.  If you do not have HIV, but are at risk, it may be recommended that you take a prescription medicine daily to prevent HIV infection. This is called pre-exposure prophylaxis (PrEP). You are considered at risk if:  You are sexually active and do not regularly use condoms or  know the HIV status of your partner(s).  You take drugs by injection.  You are sexually active with a partner who has HIV. Talk with your health care provider about whether you are at high risk of being infected with HIV. If you choose to begin PrEP, you should first be tested for HIV. You should then be tested every 3 months for as long as you are taking PrEP.  PREGNANCY   If you are premenopausal and you may become pregnant, ask your health care provider about preconception counseling.  If you may become pregnant, take 400 to 800 micrograms (mcg) of folic acid every day.  If you want to prevent pregnancy, talk to your health care provider about birth control (contraception). OSTEOPOROSIS AND MENOPAUSE   Osteoporosis is a disease in which the bones lose minerals and strength with aging. This can result in serious bone fractures. Your risk for osteoporosis can be identified using a bone density scan.  If you are 65 years of age or older, or if you are at risk for osteoporosis and fractures, ask your health care provider if you should be screened.  Ask your health care provider whether you should take a calcium or vitamin D supplement to lower your risk for osteoporosis.  Menopause may have certain physical symptoms and risks.  Hormone replacement therapy may reduce some of these symptoms and risks. Talk to your health care provider about whether hormone replacement therapy is right for you.  HOME CARE INSTRUCTIONS   Schedule regular health, dental, and eye exams.  Stay current with your immunizations.   Do not use any tobacco products including cigarettes, chewing tobacco, or electronic cigarettes.  If you are pregnant, do not drink alcohol.  If you are breastfeeding,   limit how much and how often you drink alcohol.  Limit alcohol intake to no more than 1 drink per day for nonpregnant women. One drink equals 12 ounces of beer, 5 ounces of wine, or 1 ounces of hard liquor.  Do  not use street drugs.  Do not share needles.  Ask your health care provider for help if you need support or information about quitting drugs.  Tell your health care provider if you often feel depressed.  Tell your health care provider if you have ever been abused or do not feel safe at home. Document Released: 02/22/2011 Document Revised: 12/24/2013 Document Reviewed: 07/11/2013 ExitCare Patient Information 2015 [employer]. This information is not intended to replace advice given to you by your health care provider. Make sure you discuss any questions you have with your health care provider.  

## 2015-06-30 LAB — CYTOLOGY - PAP

## 2015-07-03 ENCOUNTER — Other Ambulatory Visit: Payer: Self-pay

## 2015-08-11 ENCOUNTER — Encounter: Payer: Self-pay | Admitting: Gynecology

## 2016-04-16 DIAGNOSIS — R21 Rash and other nonspecific skin eruption: Secondary | ICD-10-CM | POA: Diagnosis not present

## 2016-04-16 DIAGNOSIS — R509 Fever, unspecified: Secondary | ICD-10-CM | POA: Diagnosis not present

## 2016-06-28 ENCOUNTER — Encounter: Payer: BLUE CROSS/BLUE SHIELD | Admitting: Gynecology

## 2016-06-28 DIAGNOSIS — Z0289 Encounter for other administrative examinations: Secondary | ICD-10-CM

## 2016-08-02 ENCOUNTER — Ambulatory Visit (INDEPENDENT_AMBULATORY_CARE_PROVIDER_SITE_OTHER): Payer: BLUE CROSS/BLUE SHIELD | Admitting: Gynecology

## 2016-08-02 ENCOUNTER — Encounter: Payer: Self-pay | Admitting: Gynecology

## 2016-08-02 VITALS — BP 120/82 | Ht 66.0 in | Wt 178.0 lb

## 2016-08-02 DIAGNOSIS — Z1321 Encounter for screening for nutritional disorder: Secondary | ICD-10-CM

## 2016-08-02 DIAGNOSIS — Z1329 Encounter for screening for other suspected endocrine disorder: Secondary | ICD-10-CM

## 2016-08-02 DIAGNOSIS — Z01411 Encounter for gynecological examination (general) (routine) with abnormal findings: Secondary | ICD-10-CM

## 2016-08-02 DIAGNOSIS — N952 Postmenopausal atrophic vaginitis: Secondary | ICD-10-CM

## 2016-08-02 DIAGNOSIS — Z13220 Encounter for screening for lipoid disorders: Secondary | ICD-10-CM

## 2016-08-02 LAB — CBC WITH DIFFERENTIAL/PLATELET
BASOS PCT: 0 %
Basophils Absolute: 0 cells/uL (ref 0–200)
EOS ABS: 96 {cells}/uL (ref 15–500)
Eosinophils Relative: 2 %
HEMATOCRIT: 40.8 % (ref 35.0–45.0)
Hemoglobin: 13.8 g/dL (ref 11.7–15.5)
LYMPHS PCT: 33 %
Lymphs Abs: 1584 cells/uL (ref 850–3900)
MCH: 30.1 pg (ref 27.0–33.0)
MCHC: 33.8 g/dL (ref 32.0–36.0)
MCV: 89.1 fL (ref 80.0–100.0)
MONO ABS: 432 {cells}/uL (ref 200–950)
MONOS PCT: 9 %
MPV: 9.9 fL (ref 7.5–12.5)
NEUTROS PCT: 56 %
Neutro Abs: 2688 cells/uL (ref 1500–7800)
PLATELETS: 320 10*3/uL (ref 140–400)
RBC: 4.58 MIL/uL (ref 3.80–5.10)
RDW: 13.8 % (ref 11.0–15.0)
WBC: 4.8 10*3/uL (ref 3.8–10.8)

## 2016-08-02 LAB — COMPREHENSIVE METABOLIC PANEL
ALK PHOS: 66 U/L (ref 33–130)
ALT: 19 U/L (ref 6–29)
AST: 20 U/L (ref 10–35)
Albumin: 4.4 g/dL (ref 3.6–5.1)
BUN: 14 mg/dL (ref 7–25)
CALCIUM: 9.3 mg/dL (ref 8.6–10.4)
CHLORIDE: 109 mmol/L (ref 98–110)
CO2: 24 mmol/L (ref 20–31)
Creat: 0.83 mg/dL (ref 0.50–1.05)
GLUCOSE: 98 mg/dL (ref 65–99)
POTASSIUM: 4.5 mmol/L (ref 3.5–5.3)
Sodium: 140 mmol/L (ref 135–146)
Total Bilirubin: 0.5 mg/dL (ref 0.2–1.2)
Total Protein: 6.7 g/dL (ref 6.1–8.1)

## 2016-08-02 LAB — LIPID PANEL
CHOL/HDL RATIO: 2.4 ratio (ref <5.0)
Cholesterol: 143 mg/dL (ref <200)
HDL: 59 mg/dL (ref >50)
LDL CALC: 72 mg/dL (ref <100)
Triglycerides: 62 mg/dL (ref <150)
VLDL: 12 mg/dL (ref <30)

## 2016-08-02 LAB — TSH: TSH: 1.13 m[IU]/L

## 2016-08-02 NOTE — Patient Instructions (Signed)

## 2016-08-02 NOTE — Progress Notes (Signed)
    Kayla RockerSandy M Kolden 12/15/1955 [id number]        60 y.o.  [id number] for annual exam.    Past medical history,surgical history, problem list, medications, allergies, family history and social history were all reviewed and documented as reviewed in the EPIC chart.  ROS:  Performed with pertinent positives and negatives included in the history, assessment and plan.   Additional significant findings :  None   Exam: Bari MantisKim Alexis assistant Vitals:   08/02/16 0804  BP: 120/82  Weight: 178 lb (80.7 kg)  Height: 5\' 6"  (1.676 m)   Body mass index is 28.73 kg/m.  General appearance:  Normal affect, orientation and appearance. Skin: Grossly normal HEENT: Without gross lesions.  No cervical or supraclavicular adenopathy. Thyroid normal.  Lungs:  Clear without wheezing, rales or rhonchi Cardiac: RR, without RMG Abdominal:  Soft, nontender, without masses, guarding, rebound, organomegaly or hernia Breasts:  Examined lying and sitting without masses, retractions, discharge or axillary adenopathy.  Bilateral implants noted Pelvic:  Ext, BUS, Vagina with atrophic changes  Cervix with atrophic changes  Uterus anteverted, normal size, shape and contour, midline and mobile nontender   Adnexa without masses or tenderness    Anus and perineum normal   Rectovaginal normal sphincter tone without palpated masses or tenderness.    Assessment/Plan:  60 y.o. [id number] female for annual exam.   1. Postmenopausal/atrophic genital changes. No significant hot flushes, night sweats, vaginal dryness or any vaginal bleeding. Continue to monitor report any issues or bleeding. 2. Pap smear/HPV 2016. No Pap smear done today. 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 Normal Pap smears afterwards. 3. Mammography due now and I reminded patient to schedule. SBE monthly reviewed. 4. Colonoscopy 2016. Repeat at their recommended interval. 5. DEXA never. Will plan further into the menopause. Check vitamin D level  today. 6. Maintenance. Patient requests baseline labs. CBC, CMP, lipid profile, TSH, vitamin D, urinalysis ordered. Follow up 1 year, sooner as needed.   Dara LordsFONTAINE,Rida Loudin P MD, 8:18 AM 08/02/2016

## 2016-08-03 LAB — URINALYSIS W MICROSCOPIC + REFLEX CULTURE
BACTERIA UA: NONE SEEN [HPF]
Bilirubin Urine: NEGATIVE
CASTS: NONE SEEN [LPF]
Glucose, UA: NEGATIVE
HGB URINE DIPSTICK: NEGATIVE
Ketones, ur: NEGATIVE
LEUKOCYTES UA: NEGATIVE
NITRITE: NEGATIVE
PROTEIN: NEGATIVE
Specific Gravity, Urine: 1.024 (ref 1.001–1.035)
YEAST: NONE SEEN [HPF]
pH: 6 (ref 5.0–8.0)

## 2016-08-03 LAB — VITAMIN D 25 HYDROXY (VIT D DEFICIENCY, FRACTURES): VIT D 25 HYDROXY: 24 ng/mL — AB (ref 30–100)

## 2016-08-04 LAB — URINE CULTURE: Organism ID, Bacteria: NO GROWTH

## 2016-11-17 DIAGNOSIS — R350 Frequency of micturition: Secondary | ICD-10-CM | POA: Diagnosis not present

## 2016-11-17 DIAGNOSIS — N390 Urinary tract infection, site not specified: Secondary | ICD-10-CM | POA: Diagnosis not present

## 2017-01-12 ENCOUNTER — Other Ambulatory Visit: Payer: Self-pay | Admitting: Gynecology

## 2017-01-12 DIAGNOSIS — E559 Vitamin D deficiency, unspecified: Secondary | ICD-10-CM

## 2017-01-31 ENCOUNTER — Encounter: Payer: Self-pay | Admitting: Adult Health

## 2017-01-31 ENCOUNTER — Ambulatory Visit (INDEPENDENT_AMBULATORY_CARE_PROVIDER_SITE_OTHER): Payer: BLUE CROSS/BLUE SHIELD | Admitting: Adult Health

## 2017-01-31 DIAGNOSIS — G43909 Migraine, unspecified, not intractable, without status migrainosus: Secondary | ICD-10-CM | POA: Diagnosis not present

## 2017-01-31 DIAGNOSIS — Z0000 Encounter for general adult medical examination without abnormal findings: Secondary | ICD-10-CM | POA: Insufficient documentation

## 2017-01-31 NOTE — Assessment & Plan Note (Signed)
Increase water intake to at least 90 ounces daily. Recommend OTC Acetaminophen for HA pain-NSAIDs have not worked well in the past.

## 2017-01-31 NOTE — Patient Instructions (Signed)
Heart-Healthy Eating Plan Many factors influence your heart health, including eating and exercise habits. Heart (coronary) risk increases with abnormal blood fat (lipid) levels. Heart-healthy meal planning includes limiting unhealthy fats, increasing healthy fats, and making other small dietary changes. This includes maintaining a healthy body weight to help keep lipid levels within a normal range. What is my plan? Your health care provider recommends that you:  Get no more than _________% of the total calories in your daily diet from fat.  Limit your intake of saturated fat to less than _________% of your total calories each day.  Limit the amount of cholesterol in your diet to less than _________ mg per day.  What types of fat should I choose?  Choose healthy fats more often. Choose monounsaturated and polyunsaturated fats, such as olive oil and canola oil, flaxseeds, walnuts, almonds, and seeds.  Eat more omega-3 fats. Good choices include salmon, mackerel, sardines, tuna, flaxseed oil, and ground flaxseeds. Aim to eat fish at least two times each week.  Limit saturated fats. Saturated fats are primarily found in animal products, such as meats, butter, and cream. Plant sources of saturated fats include palm oil, palm kernel oil, and coconut oil.  Avoid foods with partially hydrogenated oils in them. These contain trans fats. Examples of foods that contain trans fats are stick margarine, some tub margarines, cookies, crackers, and other baked goods. What general guidelines do I need to follow?  Check food labels carefully to identify foods with trans fats or high amounts of saturated fat.  Fill one half of your plate with vegetables and green salads. Eat 4-5 servings of vegetables per day. A serving of vegetables equals 1 cup of raw leafy vegetables,  cup of raw or cooked cut-up vegetables, or  cup of vegetable juice.  Fill one fourth of your plate with whole grains. Look for the word  "whole" as the first word in the ingredient list.  Fill one fourth of your plate with lean protein foods.  Eat 4-5 servings of fruit per day. A serving of fruit equals one medium whole fruit,  cup of dried fruit,  cup of fresh, frozen, or canned fruit, or  cup of 100% fruit juice.  Eat more foods that contain soluble fiber. Examples of foods that contain this type of fiber are apples, broccoli, carrots, beans, peas, and barley. Aim to get 20-30 g of fiber per day.  Eat more home-cooked food and less restaurant, buffet, and fast food.  Limit or avoid alcohol.  Limit foods that are high in starch and sugar.  Avoid fried foods.  Cook foods by using methods other than frying. Baking, boiling, grilling, and broiling are all great options. Other fat-reducing suggestions include: ? Removing the skin from poultry. ? Removing all visible fats from meats. ? Skimming the fat off of stews, soups, and gravies before serving them. ? Steaming vegetables in water or broth.  Lose weight if you are overweight. Losing just 5-10% of your initial body weight can help your overall health and prevent diseases such as diabetes and heart disease.  Increase your consumption of nuts, legumes, and seeds to 4-5 servings per week. One serving of dried beans or legumes equals  cup after being cooked, one serving of nuts equals 1 ounces, and one serving of seeds equals  ounce or 1 tablespoon.  You may need to monitor your salt (sodium) intake, especially if you have high blood pressure. Talk with your health care provider or dietitian to get  more information about reducing sodium. What foods can I eat? Grains  Breads, including French, white, pita, wheat, raisin, rye, oatmeal, and Italian. Tortillas that are neither fried nor made with lard or trans fat. Low-fat rolls, including hotdog and hamburger buns and English muffins. Biscuits. Muffins. Waffles. Pancakes. Light popcorn. Whole-grain cereals. Flatbread.  Melba toast. Pretzels. Breadsticks. Rusks. Low-fat snacks and crackers, including oyster, saltine, matzo, graham, animal, and rye. Rice and pasta, including brown rice and those that are made with whole wheat. Vegetables All vegetables. Fruits All fruits, but limit coconut. Meats and Other Protein Sources Lean, well-trimmed beef, veal, pork, and lamb. Chicken and turkey without skin. All fish and shellfish. Wild duck, rabbit, pheasant, and venison. Egg whites or low-cholesterol egg substitutes. Dried beans, peas, lentils, and tofu.Seeds and most nuts. Dairy Low-fat or nonfat cheeses, including ricotta, string, and mozzarella. Skim or 1% milk that is liquid, powdered, or evaporated. Buttermilk that is made with low-fat milk. Nonfat or low-fat yogurt. Beverages Mineral water. Diet carbonated beverages. Sweets and Desserts Sherbets and fruit ices. Honey, jam, marmalade, jelly, and syrups. Meringues and gelatins. Pure sugar candy, such as hard candy, jelly beans, gumdrops, mints, marshmallows, and small amounts of dark chocolate. Angel food cake. Eat all sweets and desserts in moderation. Fats and Oils Nonhydrogenated (trans-free) margarines. Vegetable oils, including soybean, sesame, sunflower, olive, peanut, safflower, corn, canola, and cottonseed. Salad dressings or mayonnaise that are made with a vegetable oil. Limit added fats and oils that you use for cooking, baking, salads, and as spreads. Other Cocoa powder. Coffee and tea. All seasonings and condiments. The items listed above may not be a complete list of recommended foods or beverages. Contact your dietitian for more options. What foods are not recommended? Grains Breads that are made with saturated or trans fats, oils, or whole milk. Croissants. Butter rolls. Cheese breads. Sweet rolls. Donuts. Buttered popcorn. Chow mein noodles. High-fat crackers, such as cheese or butter crackers. Meats and Other Protein Sources Fatty meats, such  as hotdogs, short ribs, sausage, spareribs, bacon, ribeye roast or steak, and mutton. High-fat deli meats, such as salami and bologna. Caviar. Domestic duck and goose. Organ meats, such as kidney, liver, sweetbreads, brains, gizzard, chitterlings, and heart. Dairy Cream, sour cream, cream cheese, and creamed cottage cheese. Whole milk cheeses, including blue (bleu), Monterey Jack, Brie, Colby, American, Havarti, Swiss, cheddar, Camembert, and [place]. Whole or 2% milk that is liquid, evaporated, or condensed. Whole buttermilk. Cream sauce or high-fat cheese sauce. Yogurt that is made from whole milk. Beverages Regular sodas and drinks with added sugar. Sweets and Desserts Frosting. Pudding. Cookies. Cakes other than angel food cake. Candy that has milk chocolate or white chocolate, hydrogenated fat, butter, coconut, or unknown ingredients. Buttered syrups. Full-fat ice cream or ice cream drinks. Fats and Oils Gravy that has suet, meat fat, or shortening. Cocoa butter, hydrogenated oils, palm oil, coconut oil, palm kernel oil. These can often be found in baked products, candy, fried foods, nondairy creamers, and whipped toppings. Solid fats and shortenings, including bacon fat, salt pork, lard, and butter. Nondairy cream substitutes, such as coffee creamers and sour cream substitutes. Salad dressings that are made of unknown oils, cheese, or sour cream. The items listed above may not be a complete list of foods and beverages to avoid. Contact your dietitian for more information. This information is not intended to replace advice given to you by your health care provider. Make sure you discuss any questions you have with your health care   provider. Document Released: 05/18/2008 Document Revised: 02/27/2016 Document Reviewed: 01/31/2014 Elsevier Interactive Patient Education  2017 [employer]  Acetaminophen; Aspirin, ASA; Caffeine tablets, caplets, or geltabs What is this medicine? ACETAMINOPHEN;  ASPIRIN; CAFFEINE (a set a MEE noe fen; AS pir in; KAF een) is a pain reliever. It is used to treat mild aches and pains. This medicine may help with arthritis, colds, headache (including migraine), muscle aches, menstrual cramps, sinusitis, and toothache. This medicine may be used for other purposes; ask your health care provider or pharmacist if you have questions. COMMON BRAND NAME(S): Bayer Migraine Formula, Excedrin Extra Strength, Excedrin Extra Strength Express, Excedrin Menstrual Complete, Excedrin Migraine, Genaced, Goody's Extra Strength, Pain Reliever Plus, Pamprin, Vanquish What should I tell my health care provider before I take this medicine? They need to know if you have any of these conditions: -anemia -anxiety or panic attacks -asthma -bleeding problems -child with chickenpox, the flu, or other viral infection -diabetes -gout -heart disease -high blood pressure -if you often drink alcohol -kidney disease -liver disease -low level of vitamin K -lupus -smoke tobacco -stomach ulcers or other problems -trouble sleeping -an unusual or allergic reaction to acetaminophen, aspirin, caffeine, other medicines, foods, dyes, or preservatives -pregnant or trying to get pregnant -breast-feeding How should I use this medicine? Take this medicine by mouth with a glass of water. Follow the directions on the package or prescription label. You can take this medicine with or without food. If it upsets your stomach, take it with food. Take your medicine at regular intervals. Do not take your medicine more often than directed. Talk to your pediatrician regarding the use of this medicine in children. While this drug may be prescribed for children as young as 12 years for selected conditions, precautions do apply. Patients over 17 years old may have a stronger reaction and need a smaller dose. Overdosage: If you think you have taken too much of this medicine contact a poison control center or  emergency room at once. NOTE: This medicine is only for you. Do not share this medicine with others. What if I miss a dose? If you miss a dose, take it as soon as you can. If it is almost time for your next dose, take only that dose. Do not take double or extra doses. What may interact with this medicine? Do not take this medicine with any of the following medications: -MAOIs like Carbex, Eldepryl, Marplan, Nardil, and Parnate -methotrexate -other medicines with acetaminophen -probenecid This medicine may also interact with the following medications: -alcohol -alendronate -bismuth subsalicylate -clozapine -flavocoxid -grapefruit juice -herbal supplements like feverfew, garlic, ginger, ginkgo biloba, horse chestnut -isoniazid -lithium -medicines for diabetes or glaucoma like acetazolamide, methazolamide -medicines for gout -medicines that stimulate or keep you awake -medicines that treat or prevent blood clots like enoxaparin, heparin, ticlopidine, warfarin -NSAIDs, medicines for pain and inflammation, like ibuprofen or naproxen -other aspirin and aspirin-like medicines -some cough and cold medicines like pseudoephedrine -varicella live vaccine This list may not describe all possible interactions. Give your health care provider a list of all the medicines, herbs, non-prescription drugs, or dietary supplements you use. Also tell them if you smoke, drink alcohol, or use illegal drugs. Some items may interact with your medicine. What should I watch for while using this medicine? Tell your doctor or health care professional if the pain lasts more than 10 days, if it gets worse, or if there is a new or different kind of pain. Tell your doctor if you  see redness or swelling. If you are treating a fever, check with your doctor if the fever that lasts for more than 3 days. Do not take Tylenol (acetaminophen) or medicines that have acetaminophen with this medicine. Too much acetaminophen can be  very dangerous. Always read medicine labels carefully. Report any possible overdose to your doctor or health care professional right away, even if there are no symptoms. The effects of extra doses may not be seen for many days. This medicine can irritate your stomach or cause bleeding problems. Do not smoke cigarettes or drink alcohol. Do not lie down for 30 minutes after taking this medicine to prevent irritation to your throat. If you are scheduled for any medical or dental procedure, tell your healthcare provider that you are taking this medicine. You may need to stop taking this medicine before the procedure. Do not take this medicine close to bedtime. It may prevent you from sleeping. This medicine may be used to treat migraines. If you take migraine medicines for 10 or more days a month, your migraines may get worse. Keep a diary of headache days and medicine use. Contact your healthcare professional if your migraine attacks occur more frequently. What side effects may I notice from receiving this medicine? Side effects that you should report to your doctor or health care professional as soon as possible: -allergic reactions like skin rash, itching or hives, swelling of the face, lips, or tongue -breathing problems -fast, irregular heartbeat -feeling faint or lightheaded, falls -fever or sore throat -pain on swallowing -ringing in the ears or trouble hearing -signs and symptoms of bleeding such as bloody or black, tarry stools; red or dark-brown urine; spitting up blood or brown material that looks like coffee grounds; red spots on the skin; unusual bruising or bleeding from the eye, gums, or nose -trouble passing urine or change in the amount of urine -unusually weak or tired -yellowing of the eyes or skin Side effects that usually do not require medical attention (report to your doctor or health care professional if they continue or are bothersome): -diarrhea -headache -nausea,  vomiting -passing urine more often -trouble sleeping This list may not describe all possible side effects. Call your doctor for medical advice about side effects. You may report side effects to FDA at 1-800-FDA-1088. Where should I keep my medicine? Keep out of the reach of children. Store at room temperature between 15 and 30 degrees C (59 and 86 degrees F). Keep container tightly closed. Throw away any unused medicine after the expiration date. NOTE: This sheet is a summary. It may not cover all possible information. If you have questions about this medicine, talk to your doctor, pharmacist, or health care provider.  2018 Elsevier/Gold Standard (2013-04-10 11:03:07)  Increase water intake to at least 90 ounces daily. Follow heart healthy diet. Recommend using OTC Tylenol when/if headache develops. Please follow-up every year, sooner if needed.

## 2017-01-31 NOTE — Assessment & Plan Note (Signed)
Annual CPE with fasting labs 07/2017 Increase water intake to at least 90 ounces/day. Heart healthy diet. Continue walking program-will be participating in 28 mile charity hike 05/2017. Has lost 10lbs in last 3 weeks from increased walking and reducing sugar/CHO/Na++ intake.
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
## 2017-02-01 ENCOUNTER — Other Ambulatory Visit: Payer: BLUE CROSS/BLUE SHIELD

## 2017-02-01 DIAGNOSIS — E559 Vitamin D deficiency, unspecified: Secondary | ICD-10-CM

## 2017-02-02 ENCOUNTER — Other Ambulatory Visit: Payer: Self-pay | Admitting: *Deleted

## 2017-02-02 DIAGNOSIS — E559 Vitamin D deficiency, unspecified: Secondary | ICD-10-CM

## 2017-02-02 LAB — VITAMIN D 25 HYDROXY (VIT D DEFICIENCY, FRACTURES): Vit D, 25-Hydroxy: 25 ng/mL — ABNORMAL LOW (ref 30–100)

## 2017-02-02 MED ORDER — VITAMIN D (ERGOCALCIFEROL) 1.25 MG (50000 UNIT) PO CAPS: ORAL_CAPSULE | ORAL | 0 refills | Status: DC

## 2017-04-04 ENCOUNTER — Emergency Department (HOSPITAL_COMMUNITY)
Admission: EM | Admit: 2017-04-04 | Discharge: 2017-04-04 | Disposition: A | Discharge status: Home / Self Care | Payer: BLUE CROSS/BLUE SHIELD | Attending: Emergency Medicine | Admitting: Emergency Medicine

## 2017-04-04 ENCOUNTER — Encounter (HOSPITAL_COMMUNITY): Payer: Self-pay | Admitting: Emergency Medicine

## 2017-04-04 DIAGNOSIS — R21 Rash and other nonspecific skin eruption: Secondary | ICD-10-CM | POA: Diagnosis not present

## 2017-04-04 DIAGNOSIS — T7840XA Allergy, unspecified, initial encounter: Secondary | ICD-10-CM

## 2017-04-04 DIAGNOSIS — L509 Urticaria, unspecified: Secondary | ICD-10-CM

## 2017-04-04 DIAGNOSIS — L500 Allergic urticaria: Secondary | ICD-10-CM | POA: Insufficient documentation

## 2017-04-04 MED ORDER — FAMOTIDINE 20 MG PO TABS
20.0000 mg | ORAL_TABLET | Freq: Once | ORAL | Status: AC
  Administered 2017-04-04: 20 mg via ORAL
  Filled 2017-04-04: qty 1

## 2017-04-04 MED ORDER — EPINEPHRINE 0.3 MG/0.3ML IJ SOAJ: 0.3000 mg | Freq: Once | INTRAMUSCULAR | 1 refills | Status: DC | PRN

## 2017-04-04 MED ORDER — DIPHENHYDRAMINE HCL 25 MG PO CAPS
25.0000 mg | ORAL_CAPSULE | Freq: Once | ORAL | Status: AC
  Administered 2017-04-04: 25 mg via ORAL
  Filled 2017-04-04: qty 1

## 2017-04-04 MED ORDER — FAMOTIDINE 20 MG PO TABS: 20.0000 mg | ORAL_TABLET | Freq: Two times a day (BID) | ORAL | 0 refills | Status: DC

## 2017-04-04 MED ORDER — DIPHENHYDRAMINE HCL 25 MG PO CAPS: 25.0000 mg | ORAL_CAPSULE | Freq: Four times a day (QID) | ORAL | 0 refills | Status: DC | PRN

## 2017-04-04 MED ORDER — PREDNISONE 20 MG PO TABS
60.0000 mg | ORAL_TABLET | Freq: Once | ORAL | Status: AC
  Administered 2017-04-04: 60 mg via ORAL
  Filled 2017-04-04: qty 3

## 2017-04-04 MED ORDER — PREDNISONE 20 MG PO TABS: 40.0000 mg | ORAL_TABLET | Freq: Every day | ORAL | 0 refills | Status: DC

## 2017-04-04 NOTE — Discharge Instructions (Signed)
Take Prednisone as prescribed until finished. Use Benadryl and Pepcid as prescribed, as needed, for persistent symptoms. You may substitute Benadryl for daily Zyrtec if Benadryl makes you too drowsy during the day. Use an Epipen for severe reaction. Follow up with your primary care doctor. Return to the ED for new or concerning symptoms.

## 2017-04-04 NOTE — ED Triage Notes (Signed)
Patient with itching and rash onset earlier tonight, woke her from sleep.  No new soaps or detergents per patient.  Hives, redness and rash all over body.  She was cleaning today in a garage.
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
## 2017-07-01 DIAGNOSIS — R51 Headache: Secondary | ICD-10-CM | POA: Diagnosis not present

## 2017-07-01 DIAGNOSIS — M542 Cervicalgia: Secondary | ICD-10-CM | POA: Diagnosis not present

## 2017-07-01 DIAGNOSIS — R5383 Other fatigue: Secondary | ICD-10-CM | POA: Diagnosis not present

## 2017-07-04 ENCOUNTER — Ambulatory Visit
Admission: RE | Admit: 2017-07-04 | Discharge: 2017-07-04 | Disposition: A | Discharge status: Home / Self Care | Payer: BLUE CROSS/BLUE SHIELD | Source: Ambulatory Visit | Attending: Family Medicine | Admitting: Family Medicine

## 2017-07-04 ENCOUNTER — Other Ambulatory Visit: Payer: Self-pay | Admitting: Family Medicine

## 2017-07-04 DIAGNOSIS — R51 Headache: Secondary | ICD-10-CM | POA: Diagnosis not present

## 2017-07-04 DIAGNOSIS — M542 Cervicalgia: Secondary | ICD-10-CM

## 2017-07-04 DIAGNOSIS — R519 Headache, unspecified: Secondary | ICD-10-CM

## 2017-07-05 ENCOUNTER — Other Ambulatory Visit: Payer: Self-pay | Admitting: Family Medicine

## 2017-07-07 ENCOUNTER — Encounter: Payer: Self-pay | Admitting: Neurology

## 2017-08-03 ENCOUNTER — Encounter (INDEPENDENT_AMBULATORY_CARE_PROVIDER_SITE_OTHER): Payer: Self-pay

## 2017-08-03 ENCOUNTER — Encounter: Payer: Self-pay | Admitting: Neurology

## 2017-08-03 ENCOUNTER — Ambulatory Visit (INDEPENDENT_AMBULATORY_CARE_PROVIDER_SITE_OTHER): Payer: BLUE CROSS/BLUE SHIELD | Admitting: Neurology

## 2017-08-03 DIAGNOSIS — G43009 Migraine without aura, not intractable, without status migrainosus: Secondary | ICD-10-CM | POA: Diagnosis not present

## 2017-08-03 HISTORY — DX: Migraine without aura, not intractable, without status migrainosus: G43.009

## 2017-08-03 MED ORDER — NAPROXEN 500 MG PO TABS: 500.0000 mg | ORAL_TABLET | Freq: Two times a day (BID) | ORAL | 12 refills | Status: DC | PRN

## 2017-08-03 MED ORDER — RIZATRIPTAN BENZOATE 10 MG PO TABS: 10.0000 mg | ORAL_TABLET | ORAL | 11 refills | Status: DC | PRN

## 2017-08-03 NOTE — Patient Instructions (Signed)
Magnesium citrate 400mg  to 600mg  daily, riboflavin 400mg  daily, Coenzyme Q 10 100mg  three times daily  Facebook group [facility].  Rizatriptan: Please take one tablet at the onset of your headache. If it does not improve the symptoms please take one additional tablet in 2 hours. Do not take more then 2 tablets in 24hrs. Do not take use more then 2 to 3 times in a week.   Rizatriptan tablets What is this medicine? RIZATRIPTAN (rye za TRIP tan) is used to treat migraines with or without aura. An aura is a strange feeling or visual disturbance that warns you of an attack. It is not used to prevent migraines. This medicine may be used for other purposes; ask your health care provider or pharmacist if you have questions. COMMON BRAND NAME(S): Maxalt What should I tell my health care provider before I take this medicine? They need to know if you have any of these conditions: -bowel disease or colitis -diabetes -family history of heart disease -fast or irregular heart beat -heart or blood vessel disease, angina (chest pain), or previous heart attack -high blood pressure -high cholesterol -history of stroke, transient ischemic attacks (TIAs or mini-strokes), or intracranial bleeding -kidney or liver disease -overweight -poor circulation -postmenopausal or surgical removal of uterus and ovaries -Raynaud's disease -seizure disorder -an unusual or allergic reaction to rizatriptan, other medicines, foods, dyes, or preservatives -pregnant or trying to get pregnant -breast-feeding How should I use this medicine? This medicine is taken by mouth with a glass of water. Follow the directions on the prescription label. This medicine is taken at the first symptoms of a migraine. It is not for everyday use. If your migraine headache returns after one dose, you can take another dose as directed. You must leave at least 2 hours between doses, and do not take more than 30 mg total in 24  hours. If there is no improvement at all after the first dose, do not take a second dose without talking to your doctor or health care professional. Do not take your medicine more often than directed. Talk to your pediatrician regarding the use of this medicine in children. While this drug may be prescribed for children as young as 6 years for selected conditions, precautions do apply. Overdosage: If you think you have taken too much of this medicine contact a poison control center or emergency room at once. NOTE: This medicine is only for you. Do not share this medicine with others. What if I miss a dose? This does not apply; this medicine is not for regular use. What may interact with this medicine? Do not take this medicine with any of the following medicines: -amphetamine, dextroamphetamine or cocaine -dihydroergotamine, ergotamine, ergoloid mesylates, methysergide, or ergot-type medication - do not take within 24 hours of taking rizatriptan -feverfew -MAOIs like Carbex, Eldepryl, Marplan, Nardil, and Parnate - do not take rizatriptan within 2 weeks of stopping MAOI therapy. -other migraine medicines like almotriptan, eletriptan, naratriptan, sumatriptan, zolmitriptan - do not take within 24 hours of taking rizatriptan -tryptophan This medicine may also interact with the following medications: -medicines for mental depression, anxiety or mood problems -propranolol This list may not describe all possible interactions. Give your health care provider a list of all the medicines, herbs, non-prescription drugs, or dietary supplements you use. Also tell them if you smoke, drink alcohol, or use illegal drugs. Some items may interact with your medicine. What should I watch for while using this medicine? Only take this medicine  for a migraine headache. Take it if you get warning symptoms or at the start of a migraine attack. It is not for regular use to prevent migraine attacks. You may get drowsy or  dizzy. Do not drive, use machinery, or do anything that needs mental alertness until you know how this medicine affects you. To reduce dizzy or fainting spells, do not sit or stand up quickly, especially if you are an older patient. Alcohol can increase drowsiness, dizziness and flushing. Avoid alcoholic drinks. Smoking cigarettes may increase the risk of heart-related side effects from using this medicine. If you take migraine medicines for 10 or more days a month, your migraines may get worse. Keep a diary of headache days and medicine use. Contact your healthcare professional if your migraine attacks occur more frequently. What side effects may I notice from receiving this medicine? Side effects that you should report to your doctor or health care professional as soon as possible: -allergic reactions like skin rash, itching or hives, swelling of the face, lips, or tongue -fast, slow, or irregular heart beat -increased or decreased blood pressure -seizures -severe stomach pain and cramping, bloody diarrhea -signs and symptoms of a blood clot such as breathing problems; changes in vision; chest pain; severe, sudden headache; pain, swelling, warmth in the leg; trouble speaking; sudden numbness or weakness of the face, arm or leg -tingling, pain, or numbness in the face, hands, or feet Side effects that usually do not require medical attention (report to your doctor or health care professional if they continue or are bothersome): -drowsiness -dry mouth -feeling warm, flushing, or redness of the face -headache -muscle cramps, pain -nausea, vomiting -unusually weak or tired This list may not describe all possible side effects. Call your doctor for medical advice about side effects. You may report side effects to FDA at 1-800-FDA-1088. Where should I keep my medicine? Keep out of the reach of children. Store at room temperature between 15 and 30 degrees C (59 and 86 degrees F). Keep container  tightly closed. Throw away any unused medicine after the expiration date. NOTE: This sheet is a summary. It may not cover all possible information. If you have questions about this medicine, talk to your doctor, pharmacist, or health care provider.  2018 Elsevier/Gold Standard (2013-04-10 10:16:39)
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
## 2017-08-04 ENCOUNTER — Encounter: Payer: Self-pay | Admitting: Gynecology

## 2017-08-04 ENCOUNTER — Encounter: Payer: BLUE CROSS/BLUE SHIELD | Admitting: Gynecology

## 2017-08-04 ENCOUNTER — Ambulatory Visit (INDEPENDENT_AMBULATORY_CARE_PROVIDER_SITE_OTHER): Payer: BLUE CROSS/BLUE SHIELD | Admitting: Gynecology

## 2017-08-04 VITALS — BP 120/76 | Ht 65.5 in | Wt 173.0 lb

## 2017-08-04 DIAGNOSIS — N952 Postmenopausal atrophic vaginitis: Secondary | ICD-10-CM

## 2017-08-04 DIAGNOSIS — Z01411 Encounter for gynecological examination (general) (routine) with abnormal findings: Secondary | ICD-10-CM | POA: Diagnosis not present

## 2017-08-04 DIAGNOSIS — E559 Vitamin D deficiency, unspecified: Secondary | ICD-10-CM | POA: Diagnosis not present

## 2017-08-04 NOTE — Progress Notes (Signed)
    Lahoma RockerSandy M Calise 07/04/1956 [id number]        61 y.o.  [id number] for annual gynecologic exam.  Doing well without GYN complaints  Past medical history,surgical history, problem list, medications, allergies, family history and social history were all reviewed and documented as reviewed in the EPIC chart.  ROS:  Performed with pertinent positives and negatives included in the history, assessment and plan.   Additional significant findings : None   Exam: Kennon PortelaKim Gardner assistant Vitals:   08/04/17 0914  BP: 120/76  Weight: 173 lb (78.5 kg)  Height: 5' 5.5" (1.664 m)   Body mass index is 28.35 kg/m.  General appearance:  Normal affect, orientation and appearance. Skin: Grossly normal HEENT: Without gross lesions.  No cervical or supraclavicular adenopathy. Thyroid normal.  Lungs:  Clear without wheezing, rales or rhonchi Cardiac: RR, without RMG Abdominal:  Soft, nontender, without masses, guarding, rebound, organomegaly or hernia Breasts:  Examined lying and sitting without masses, retractions, discharge or axillary adenopathy.  Bilateral implants noted Pelvic:  Ext, BUS, Vagina: With atrophic changes  Cervix: With atrophic changes  Uterus: Anteverted, normal size, shape and contour, midline and mobile nontender   Adnexa: Without masses or tenderness    Anus and perineum: Normal   Rectovaginal: Normal sphincter tone without palpated masses or tenderness.    Assessment/Plan:  61 y.o. [id number] female for annual gynecologic exam.   1. Postmenopausal/atrophic genital changes.  No significant hot flushes, night sweats, vaginal dryness or any vaginal bleeding.  Continue to monitor and report any issues or bleeding. 2. Mammography overdue.  Patient agrees to call and schedule.  Breast exam normal today.  SBE monthly reviewed. 3. Vitamin D deficiency.  Patient is supplementing vitamin D daily.  Check vitamin D level today. 4. Colonoscopy 2016.  Repeat at their recommended interval. 5. Pap  smear/HPV 06/2015.  No Pap smear done today.  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  Normal Pap smears afterwards.  Plan repeat Pap smear at 5-year interval per current screening guidelines. 6. DEXA never.  Will plan further into the menopause. 7. Health maintenance.  No routine lab work done as patient reports this done elsewhere.  Follow-up for vitamin D results.  Follow-up for mammogram.  Follow-up in 1 year for annual exam.   Dara Lordsimothy P Ishi Danser MD, 9:52 AM 08/04/2017

## 2017-08-04 NOTE — Patient Instructions (Addendum)
Schedule your mammogram. Follow up in one year for annual exam 

## 2017-08-05 LAB — VITAMIN D 25 HYDROXY (VIT D DEFICIENCY, FRACTURES): Vit D, 25-Hydroxy: 23 ng/mL — ABNORMAL LOW (ref 30–100)

## 2017-08-09 ENCOUNTER — Other Ambulatory Visit: Payer: Self-pay | Admitting: Gynecology

## 2017-08-09 DIAGNOSIS — E559 Vitamin D deficiency, unspecified: Secondary | ICD-10-CM

## 2017-08-09 MED ORDER — VITAMIN D (ERGOCALCIFEROL) 1.25 MG (50000 UNIT) PO CAPS: 50000.0000 [IU] | ORAL_CAPSULE | ORAL | 0 refills | Status: DC

## 2017-10-20 ENCOUNTER — Ambulatory Visit: Payer: BLUE CROSS/BLUE SHIELD | Admitting: Neurology

## 2017-10-20 ENCOUNTER — Ambulatory Visit (INDEPENDENT_AMBULATORY_CARE_PROVIDER_SITE_OTHER): Payer: BLUE CROSS/BLUE SHIELD | Admitting: Adult Health

## 2017-10-20 ENCOUNTER — Encounter: Payer: Self-pay | Admitting: Adult Health

## 2017-10-20 VITALS — BP 105/70 | HR 52 | Temp 98.2°F | Ht 65.5 in | Wt 171.9 lb

## 2017-10-20 DIAGNOSIS — J029 Acute pharyngitis, unspecified: Secondary | ICD-10-CM | POA: Insufficient documentation

## 2017-10-20 LAB — POCT RAPID STREP A (OFFICE): Rapid Strep A Screen: NEGATIVE

## 2017-10-20 NOTE — Assessment & Plan Note (Signed)
Strep Test Negative. Increase fluids/rest/vit c. If symptoms persist into next week, please call clinic and we will send in Azithromycin.
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
## 2017-10-20 NOTE — Patient Instructions (Signed)

## 2018-09-06 ENCOUNTER — Encounter: Payer: BLUE CROSS/BLUE SHIELD | Admitting: Gynecology

## 2018-09-22 ENCOUNTER — Encounter: Payer: BLUE CROSS/BLUE SHIELD | Admitting: Gynecology

## 2018-10-13 ENCOUNTER — Other Ambulatory Visit: Payer: Self-pay | Admitting: Adult Health

## 2018-10-13 ENCOUNTER — Ambulatory Visit (INDEPENDENT_AMBULATORY_CARE_PROVIDER_SITE_OTHER): Payer: BLUE CROSS/BLUE SHIELD | Admitting: Adult Health

## 2018-10-13 ENCOUNTER — Encounter: Payer: Self-pay | Admitting: Adult Health

## 2018-10-13 VITALS — BP 108/73 | HR 72 | Temp 98.1°F | Ht 65.5 in | Wt 187.6 lb

## 2018-10-13 DIAGNOSIS — E559 Vitamin D deficiency, unspecified: Secondary | ICD-10-CM

## 2018-10-13 DIAGNOSIS — L678 Other hair color and hair shaft abnormalities: Secondary | ICD-10-CM | POA: Diagnosis not present

## 2018-10-13 DIAGNOSIS — R5383 Other fatigue: Secondary | ICD-10-CM

## 2018-10-13 NOTE — Patient Instructions (Signed)
Fatigue If you have fatigue, you feel tired all the time and have a lack of energy or a lack of motivation. Fatigue may make it difficult to start or complete tasks because of exhaustion. In general, occasional or mild fatigue is often a normal response to activity or life. However, long-lasting (chronic) or extreme fatigue may be a symptom of a medical condition. Follow these instructions at home: General instructions  Watch your fatigue for any changes.  Go to bed and get up at the same time every day.  Avoid fatigue by pacing yourself during the day and getting enough sleep at night.  Maintain a healthy weight. Medicines  Take over-the-counter and prescription medicines only as told by your health care provider.  Take a multivitamin, if told by your health care provider.  Do not use herbal or dietary supplements unless they are approved by your health care provider. Activity   Exercise regularly, as told by your health care provider.  Use or practice techniques to help you relax, such as yoga, tai chi, meditation, or massage therapy. Eating and drinking   Avoid heavy meals in the evening.  Eat a well-balanced diet, which includes lean proteins, whole grains, plenty of fruits and vegetables, and low-fat dairy products.  Avoid consuming too much caffeine.  Avoid the use of alcohol.  Drink enough fluid to keep your urine pale yellow. Lifestyle  Change situations that cause you stress. Try to keep your work and personal schedule in balance.  Do not use any products that contain nicotine or tobacco, such as cigarettes and e-cigarettes. If you need help quitting, ask your health care provider.  Do not use drugs. Contact a health care provider if:  Your fatigue does not get better.  You have a fever.  You suddenly lose or gain weight.  You have headaches.  You have trouble falling asleep or sleeping through the night.  You feel angry, guilty, anxious, or  sad.  You are unable to have a bowel movement (constipation).  Your skin is dry.  You have swelling in your legs or another part of your body. Get help right away if:  You feel confused.  Your vision is blurry.  You feel faint or you pass out.  You have a severe headache.  You have severe pain in your abdomen, your back, or the area between your waist and hips (pelvis).  You have chest pain, shortness of breath, or an irregular or fast heartbeat.  You are unable to urinate, or you urinate less than normal.  You have abnormal bleeding, such as bleeding from the rectum, vagina, nose, lungs, or nipples.  You vomit blood.  You have thoughts about hurting yourself or others. If you ever feel like you may hurt yourself or others, or have thoughts about taking your own life, get help right away. You can go to your nearest emergency department or call:  Your local emergency services (911 in the [place]  A suicide crisis helpline, such as the [facility] at 9164775271. This is open 24 hours a day. Summary  If you have fatigue, you feel tired all the time and have a lack of energy or a lack of motivation.  Fatigue may make it difficult to start or complete tasks because of exhaustion.  Long-lasting (chronic) or extreme fatigue may be a symptom of a medical condition.  Exercise regularly, as told by your health care provider.  Change situations that cause you stress. Try to keep your  work and personal schedule in balance. This information is not intended to replace advice given to you by your health care provider. Make sure you discuss any questions you have with your health care provider. Document Released: 06/06/2007 Document Revised: 05/04/2017 Document Reviewed: 05/04/2017 Elsevier Interactive Patient Education  2019 [employer].  Increase water intake, strive for at least 90 ounces/day.   Follow Heart Healthy diet Increase regular exercise.   Recommend at least 30 minutes daily, 5 days per week of walking, jogging, biking, swimming, YouTube/Pinterest workout videos. We will call you when lab results are available. Recommend bi-weekly massage therapy to help reduce stress. NICE TO SEE YOU!
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
## 2018-10-13 NOTE — Assessment & Plan Note (Signed)
Thyroid Panel and Vit D level drawn today

## 2018-10-13 NOTE — Assessment & Plan Note (Signed)
Increase water intake, strive for at least 90 ounces/day.   Follow Heart Healthy diet Increase regular exercise.  Recommend at least 30 minutes daily, 5 days per week of walking, jogging, biking, swimming, YouTube/Pinterest workout videos. We will call you when lab results are available. Recommend bi-weekly massage therapy to help reduce stress.

## 2018-10-14 LAB — TSH: TSH: 0.956 u[IU]/mL (ref 0.450–4.500)

## 2018-10-14 LAB — VITAMIN D 25 HYDROXY (VIT D DEFICIENCY, FRACTURES): Vit D, 25-Hydroxy: 32 ng/mL (ref 30.0–100.0)

## 2018-10-14 LAB — T3: T3, Total: 116 ng/dL (ref 71–180)

## 2018-10-14 LAB — T4, FREE: Free T4: 1.06 ng/dL (ref 0.82–1.77)

## 2019-02-08 DIAGNOSIS — Z7189 Other specified counseling: Secondary | ICD-10-CM | POA: Diagnosis not present

## 2019-02-08 DIAGNOSIS — Z20828 Contact with and (suspected) exposure to other viral communicable diseases: Secondary | ICD-10-CM | POA: Diagnosis not present

## 2019-05-05 DIAGNOSIS — R05 Cough: Secondary | ICD-10-CM | POA: Diagnosis not present

## 2019-05-05 DIAGNOSIS — J019 Acute sinusitis, unspecified: Secondary | ICD-10-CM | POA: Diagnosis not present

## 2019-05-05 DIAGNOSIS — J302 Other seasonal allergic rhinitis: Secondary | ICD-10-CM | POA: Diagnosis not present

## 2019-05-10 ENCOUNTER — Other Ambulatory Visit: Payer: Self-pay

## 2019-05-10 ENCOUNTER — Ambulatory Visit (INDEPENDENT_AMBULATORY_CARE_PROVIDER_SITE_OTHER): Payer: BC Managed Care – PPO | Admitting: Adult Health

## 2019-05-10 ENCOUNTER — Encounter: Payer: Self-pay | Admitting: Adult Health

## 2019-05-10 DIAGNOSIS — J329 Chronic sinusitis, unspecified: Secondary | ICD-10-CM | POA: Diagnosis not present
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
## 2019-05-10 NOTE — Assessment & Plan Note (Signed)
Assessment and Plan: Complete Clarithromycin course TAKE course of Prednisone and use Tessalon PRN Push fluids/rest Remain home while acutely ill PCN allergy, currently on Macrolide   Follow Up Instructions: If sx's persist after ABX complete- call clinic    I discussed the assessment and treatment plan with the patient. The patient was provided an opportunity to ask questions and all were answered. The patient agreed with the plan and demonstrated an understanding of the instructions.   The patient was advised to call back or seek an in-person evaluation if the symptoms worsen or if the condition fails to improve as anticipated.

## 2019-05-22 ENCOUNTER — Encounter: Payer: Self-pay | Admitting: Gynecology

## 2019-06-17 IMAGING — DX DG CERVICAL SPINE 2 OR 3 VIEWS
3 series · 3 of 3 positions shown · non-contrast
Comparison: None.

CLINICAL DATA: Neck pain for 3 months. Migraine headaches. Neck
pain 1 when looking up.

EXAM:
CERVICAL SPINE - 2-3 VIEW

[dg cervical spine 2 or 3 views (1 of 3)]
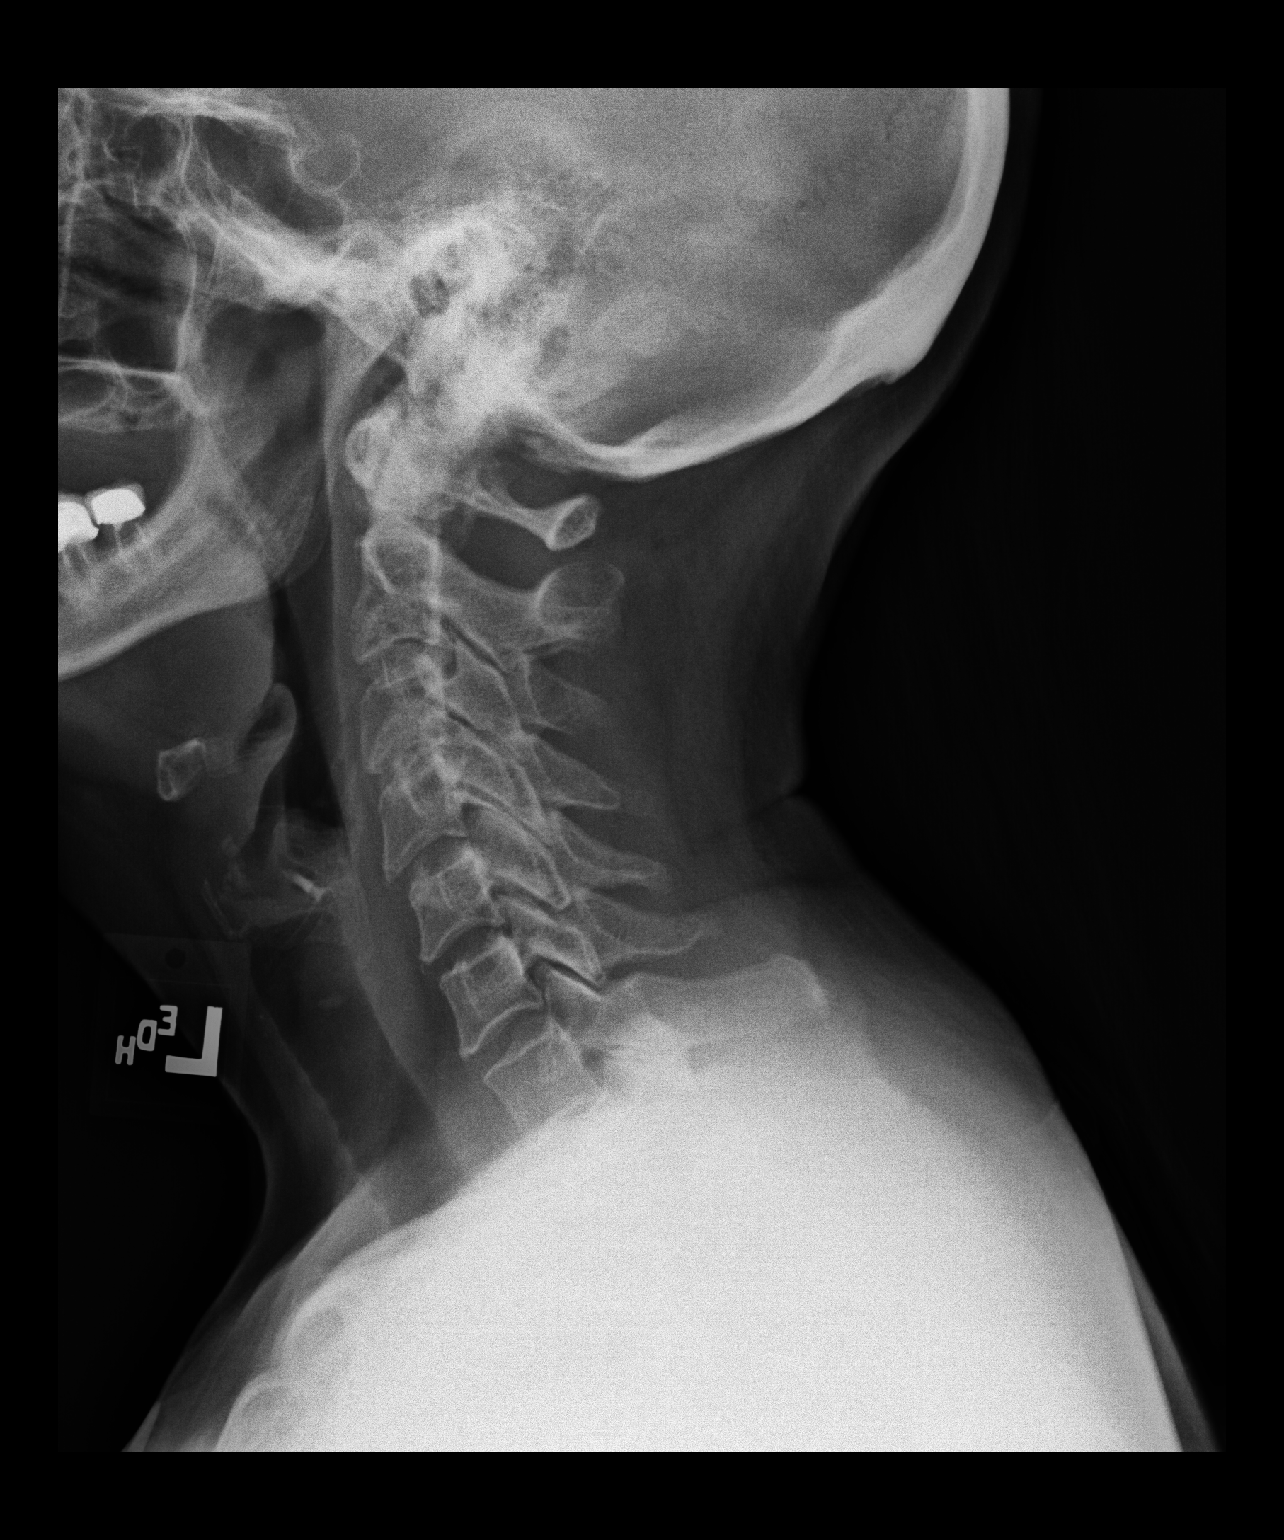

[dg cervical spine 2 or 3 views (2 of 3)]
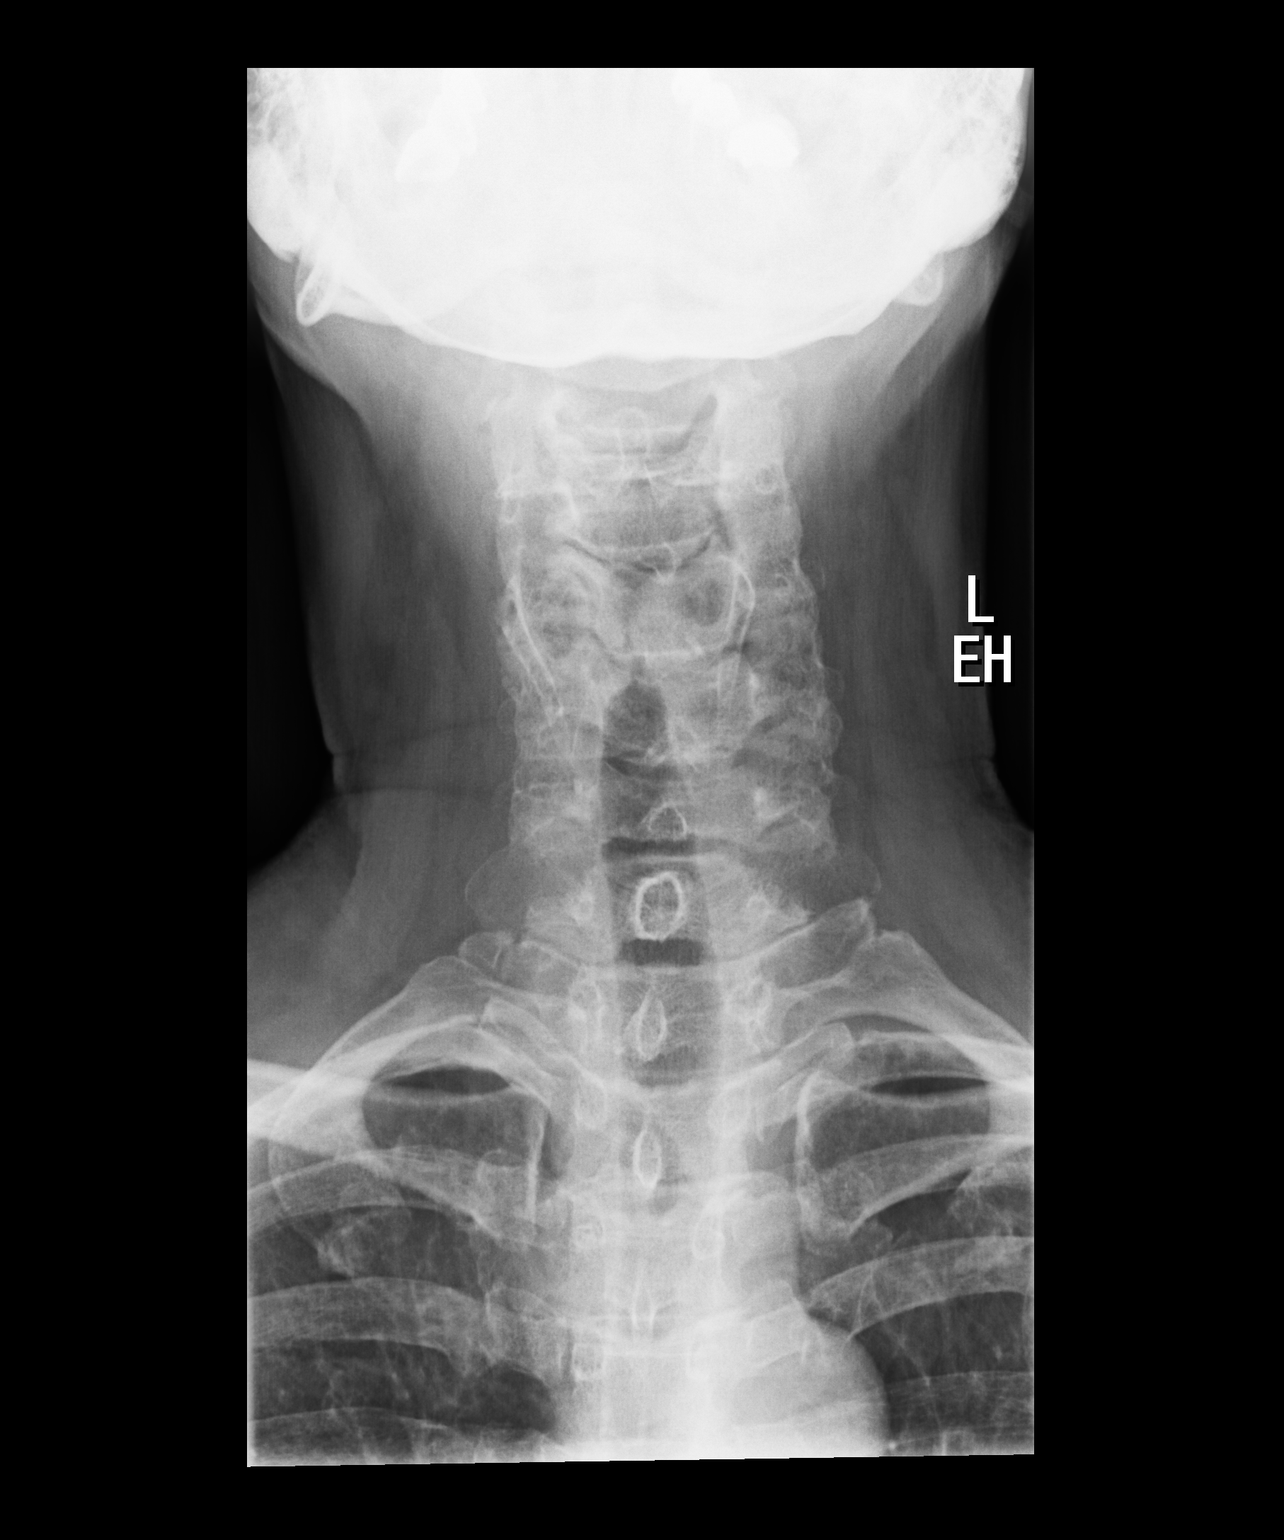

[dg cervical spine 2 or 3 views (3 of 3)]
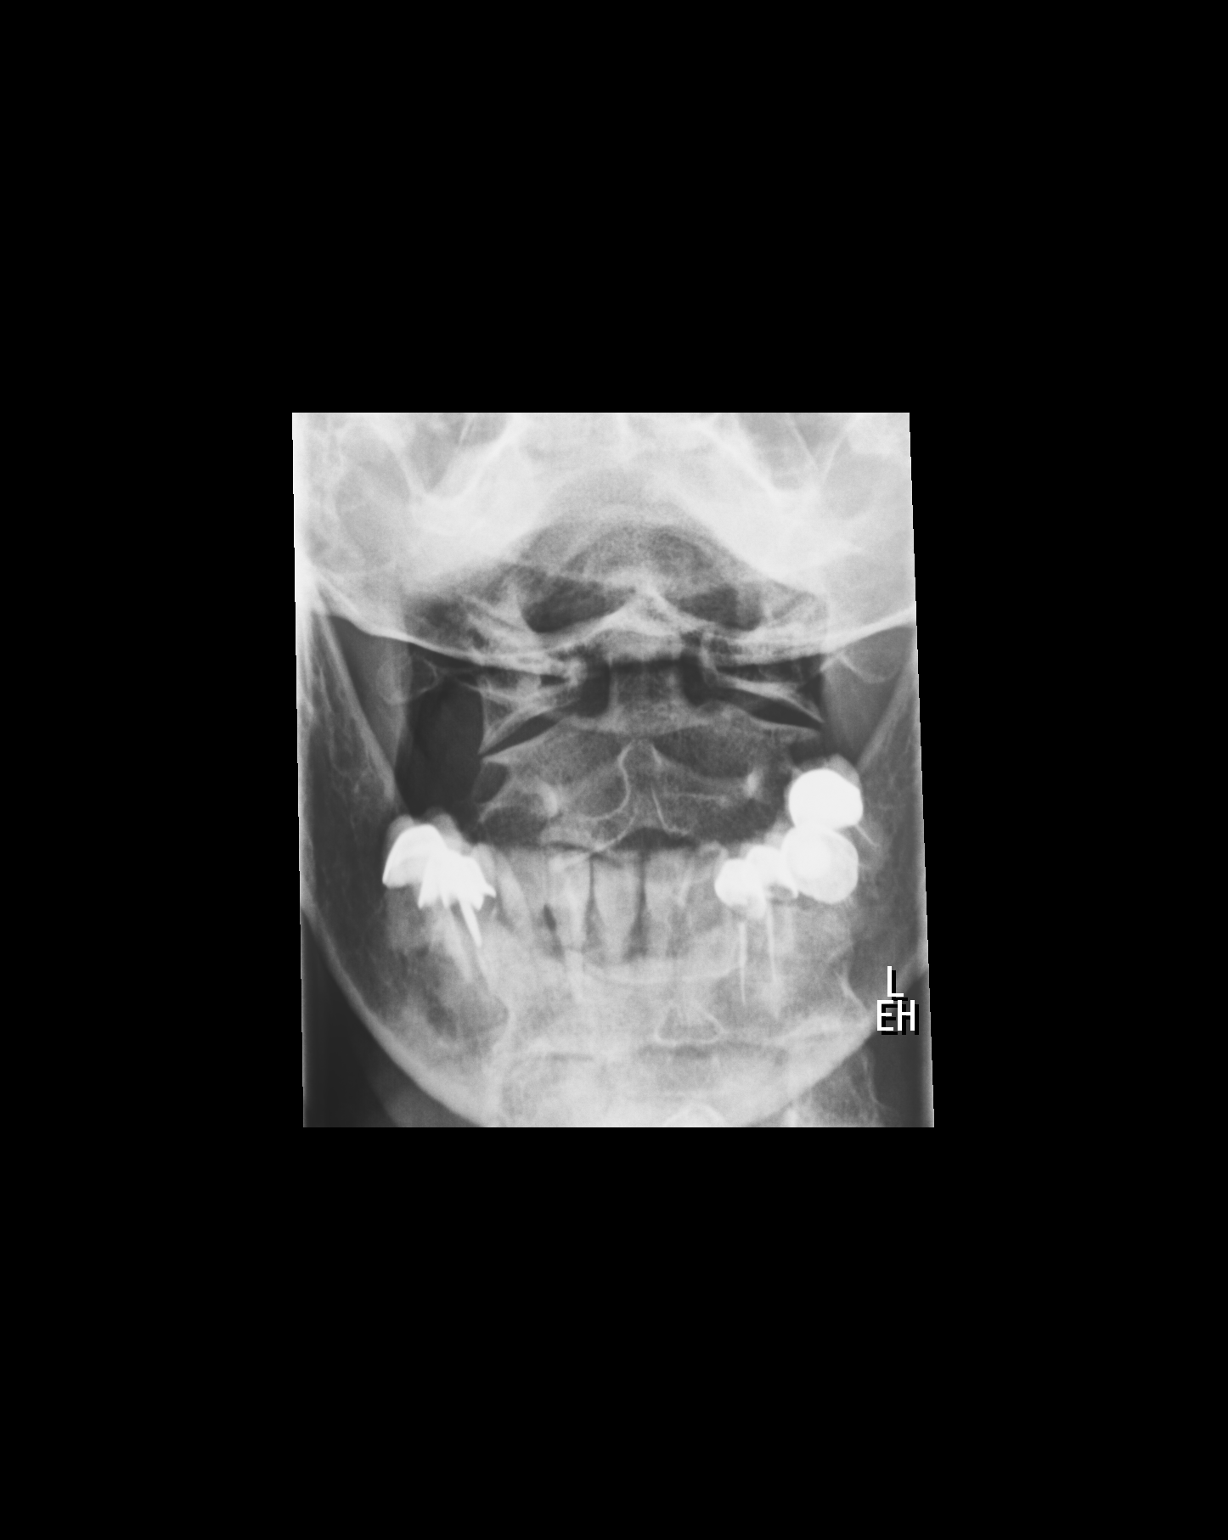

[3 of 3 positions shown; findings below may reference images not displayed]

FINDINGS: The prevertebral soft tissues are within normal limits. Vertebral
body heights and alignment are normal. Uncovertebral disease is most
pronounced at C2-3, C3-4, and C4-5. This may contribute to a
cervicalgia and cervical radiculopathy.
IMPRESSION: 1. Moderate degenerative change with uncovertebral spurring at C2-3,
C3-4, and C4-5. See 3 4 appears to be the worst level.
2. No acute abnormality.

## 2019-06-17 IMAGING — CT CT HEAD W/O CM
3 of 4 series · 17 of 47 positions shown, 20 images · non-contrast
Comparison: 01/26/2008, 01/25/2008

CLINICAL DATA: Severe headache

EXAM:
CT HEAD WITHOUT CONTRAST
TECHNIQUE: Contiguous axial images were obtained from the base of the skull
through the vertex without intravenous contrast.

[Series 32: 3d filtered head w/o · axial · non-contrast · 0.44mm/px · z∈[-21,+104]mm · 11 of 31 slices shown, 14 images]
[im 3/31  brain]
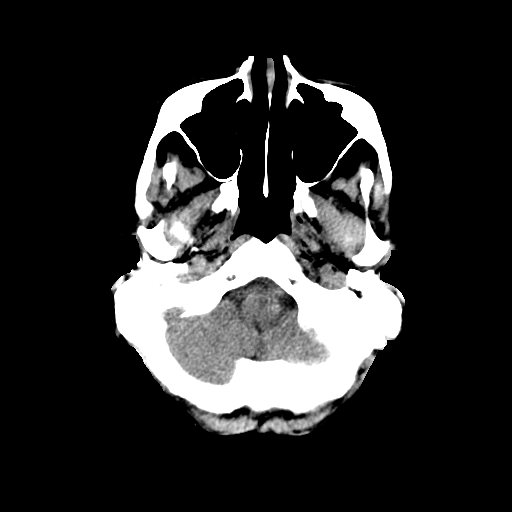
[im 3/31  bone]
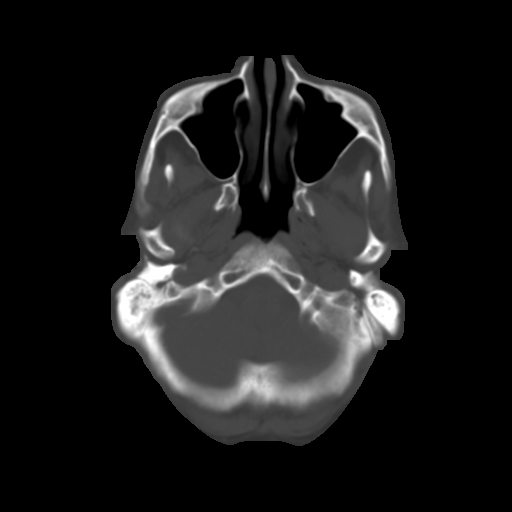
[im 5/31  brain]
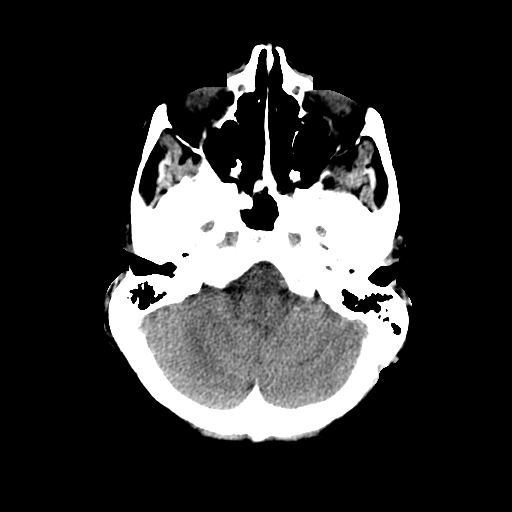
[im 7/31  brain]
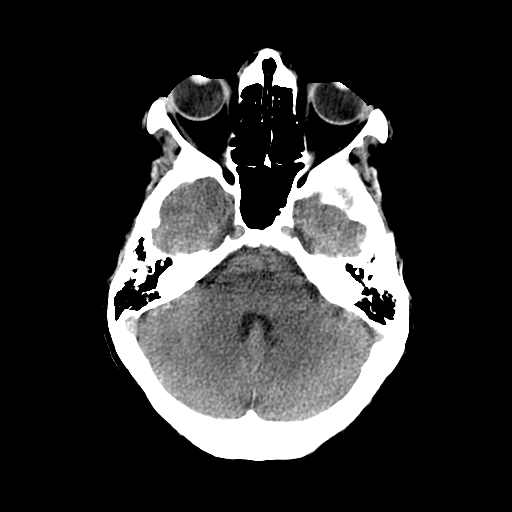
[im 11/31  brain]
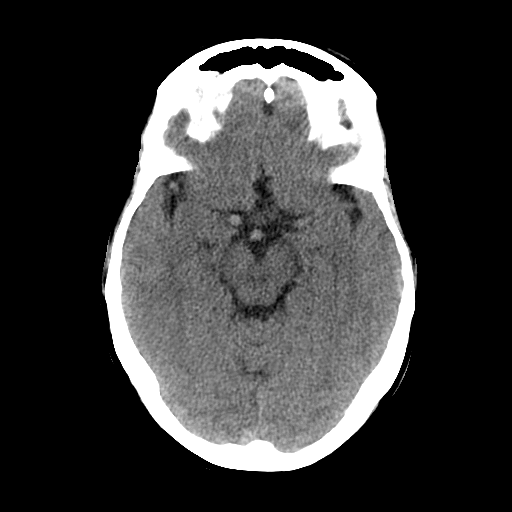
[im 13/31  brain]
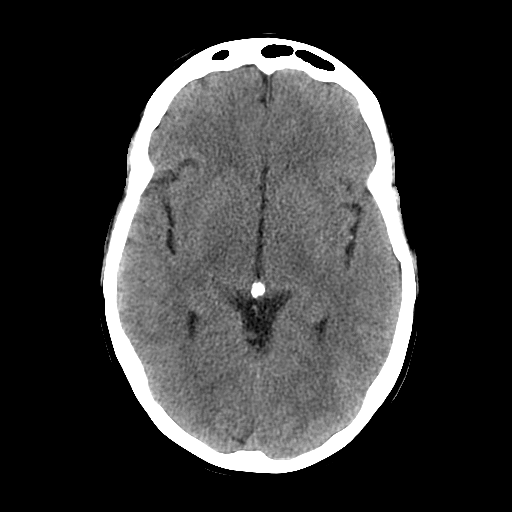
[im 13/31  bone]
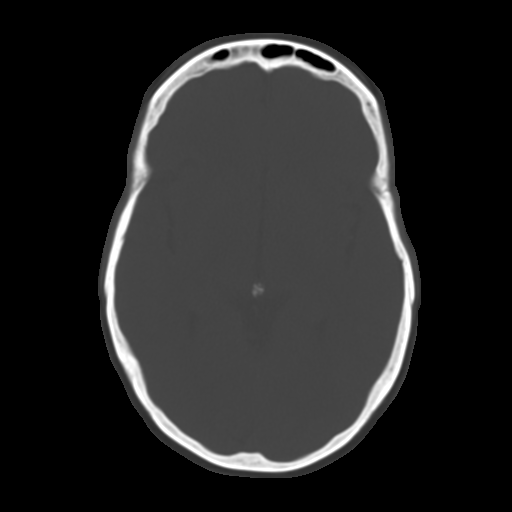
[im 16/31  brain]
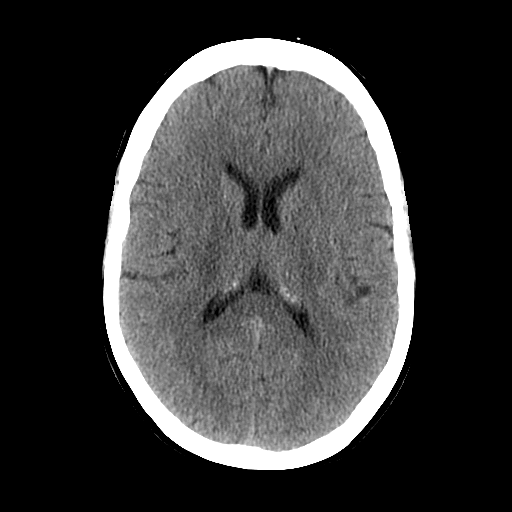
[im 18/31  brain]
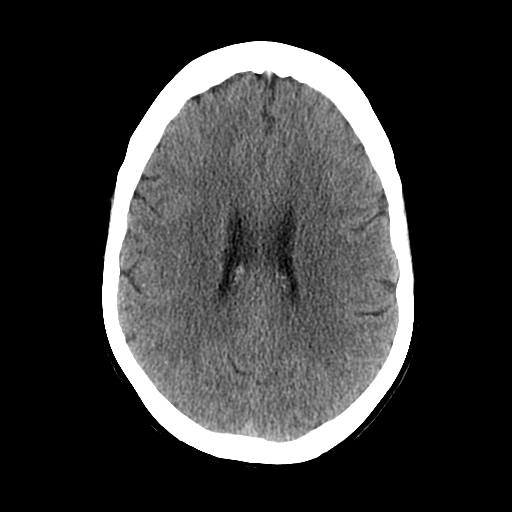
[im 20/31  brain]
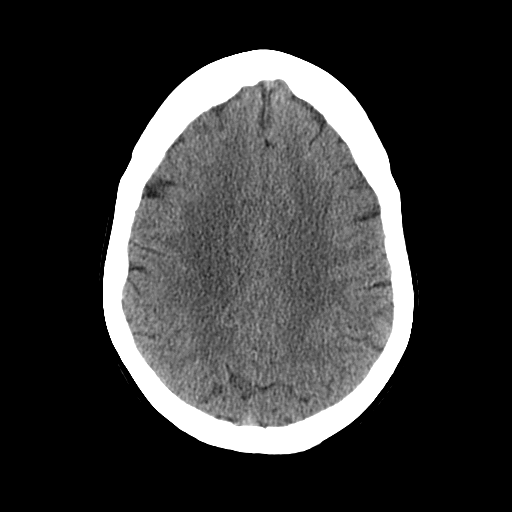
[im 24/31  brain]
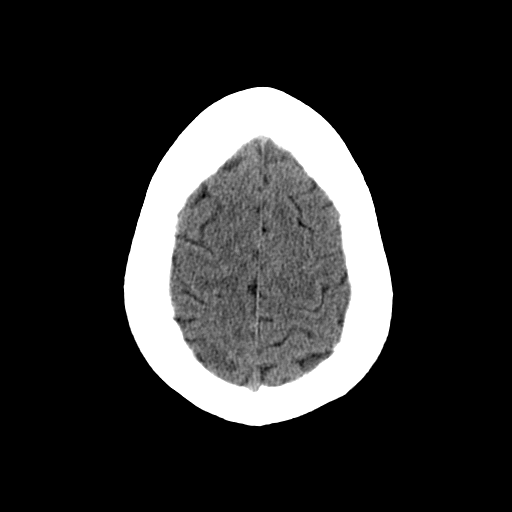
[im 24/31  bone]
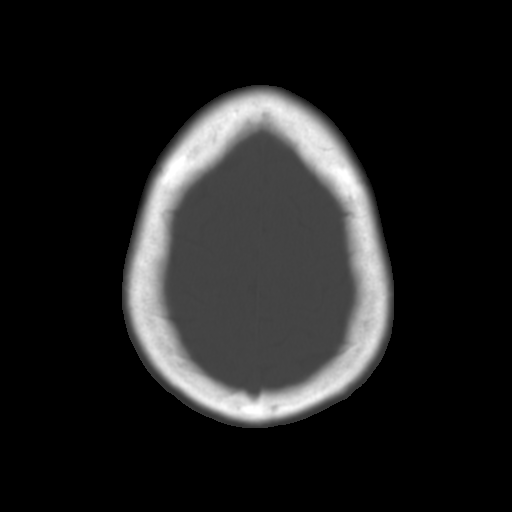
[im 26/31  brain]
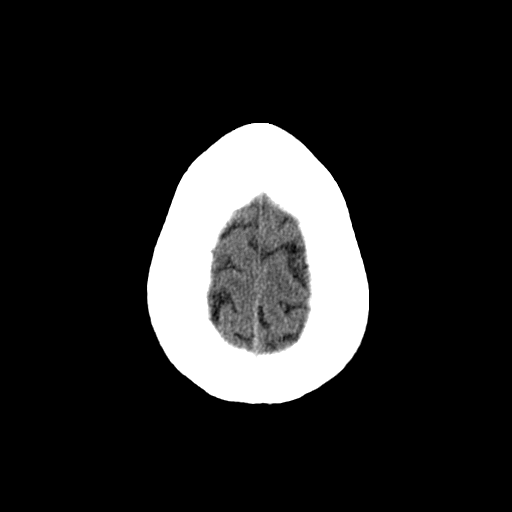
[im 28/31  brain]
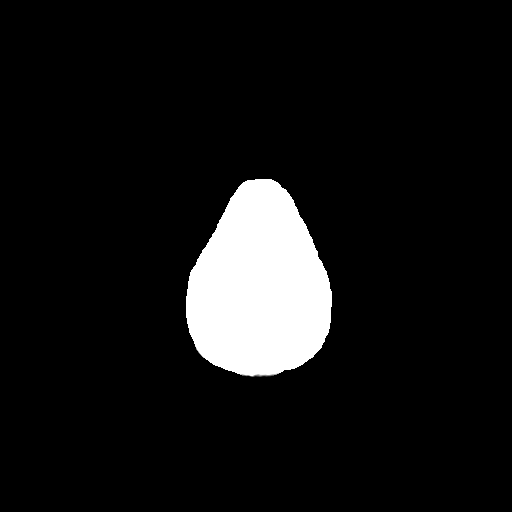

[Series 601: coronal brain · coronal · 0.44mm/px · 3 of 70 slices shown]
[im 24/70  brain]
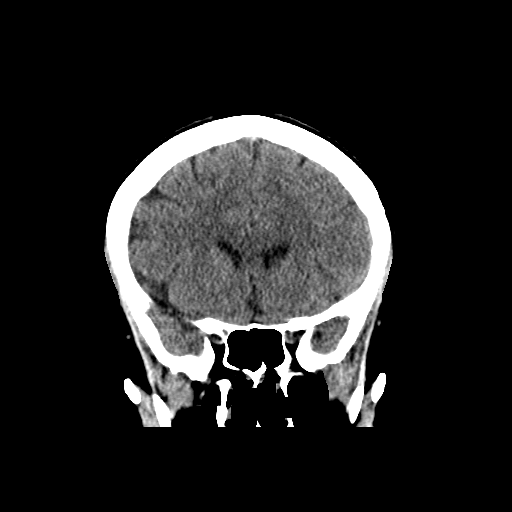
[im 31/70  brain]
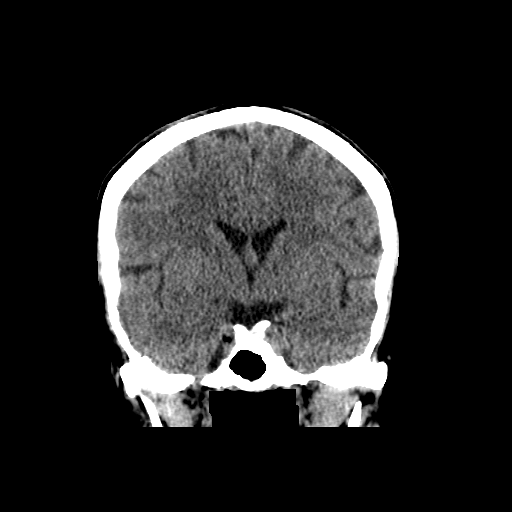
[im 39/70  brain]
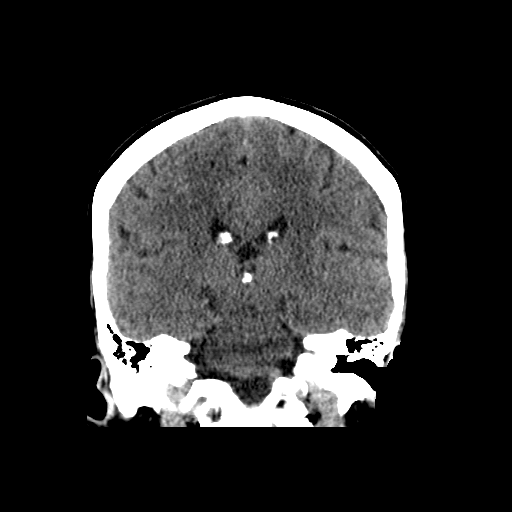

[Series 602: sagittal brain · sagittal · 0.44mm/px · 3 of 56 slices shown]
[im 19/56  brain]
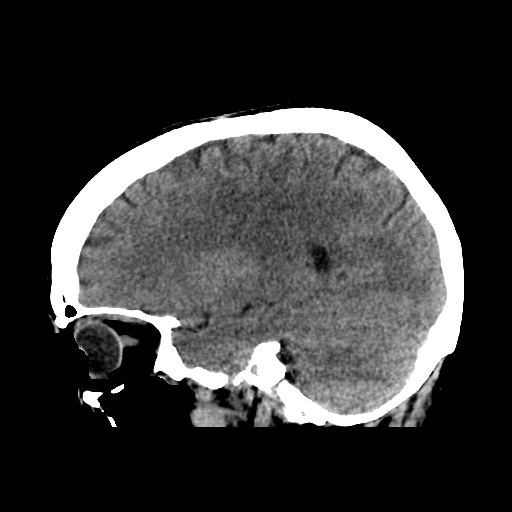
[im 28/56  brain]
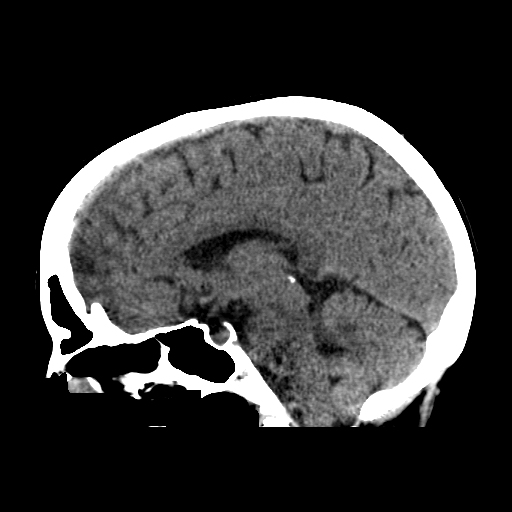
[im 37/56  brain]
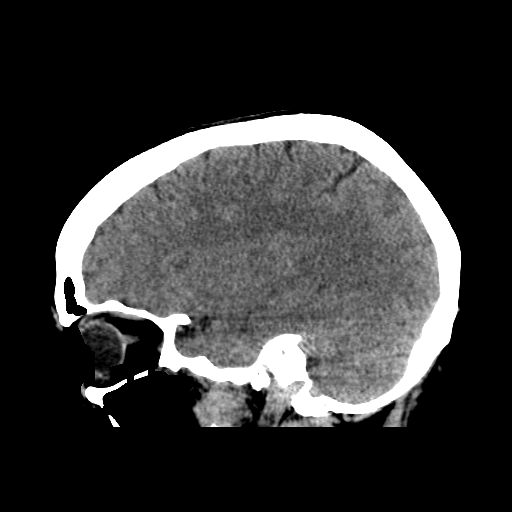

[17 of 47 positions shown; findings below may reference images not displayed]

FINDINGS: Brain: No acute territorial infarction, hemorrhage, or intracranial
mass is visualized. The ventricles are nonenlarged.

Vascular: No hyperdense vessel or unexpected calcification.

Skull: Normal. Negative for fracture or focal lesion.

Sinuses/Orbits: No acute finding.

Other: None
IMPRESSION: No CT evidence for acute intracranial abnormality.

## 2019-07-23 ENCOUNTER — Other Ambulatory Visit: Payer: Self-pay

## 2019-07-23 DIAGNOSIS — Z20822 Contact with and (suspected) exposure to covid-19: Secondary | ICD-10-CM

## 2019-07-23 DIAGNOSIS — Z9189 Other specified personal risk factors, not elsewhere classified: Secondary | ICD-10-CM | POA: Diagnosis not present

## 2019-07-23 DIAGNOSIS — Z20828 Contact with and (suspected) exposure to other viral communicable diseases: Secondary | ICD-10-CM | POA: Diagnosis not present

## 2019-07-25 LAB — NOVEL CORONAVIRUS, NAA: SARS-CoV-2, NAA: NOT DETECTED

## 2019-08-01 DIAGNOSIS — Z20828 Contact with and (suspected) exposure to other viral communicable diseases: Secondary | ICD-10-CM | POA: Diagnosis not present

## 2019-08-01 DIAGNOSIS — Z9189 Other specified personal risk factors, not elsewhere classified: Secondary | ICD-10-CM | POA: Diagnosis not present

## 2019-09-10 DIAGNOSIS — Z9189 Other specified personal risk factors, not elsewhere classified: Secondary | ICD-10-CM | POA: Diagnosis not present

## 2019-09-10 DIAGNOSIS — Z20822 Contact with and (suspected) exposure to covid-19: Secondary | ICD-10-CM | POA: Diagnosis not present

## 2020-02-18 DIAGNOSIS — Z1159 Encounter for screening for other viral diseases: Secondary | ICD-10-CM | POA: Diagnosis not present

## 2020-02-18 DIAGNOSIS — Z13220 Encounter for screening for lipoid disorders: Secondary | ICD-10-CM | POA: Diagnosis not present

## 2020-02-18 DIAGNOSIS — Z0000 Encounter for general adult medical examination without abnormal findings: Secondary | ICD-10-CM | POA: Diagnosis not present

## 2020-05-21 DIAGNOSIS — Z20828 Contact with and (suspected) exposure to other viral communicable diseases: Secondary | ICD-10-CM | POA: Diagnosis not present

## 2020-08-08 ENCOUNTER — Emergency Department (HOSPITAL_BASED_OUTPATIENT_CLINIC_OR_DEPARTMENT_OTHER)
Admission: EM | Admit: 2020-08-08 | Discharge: 2020-08-09 | Disposition: A | Discharge status: Home / Self Care | Payer: BC Managed Care – PPO | Attending: Emergency Medicine | Admitting: Emergency Medicine

## 2020-08-08 ENCOUNTER — Encounter (HOSPITAL_BASED_OUTPATIENT_CLINIC_OR_DEPARTMENT_OTHER): Payer: Self-pay

## 2020-08-08 ENCOUNTER — Other Ambulatory Visit: Payer: Self-pay

## 2020-08-08 ENCOUNTER — Emergency Department (HOSPITAL_BASED_OUTPATIENT_CLINIC_OR_DEPARTMENT_OTHER): Payer: BC Managed Care – PPO

## 2020-08-08 DIAGNOSIS — H81399 Other peripheral vertigo, unspecified ear: Secondary | ICD-10-CM | POA: Diagnosis not present

## 2020-08-08 DIAGNOSIS — R42 Dizziness and giddiness: Secondary | ICD-10-CM | POA: Diagnosis not present

## 2020-08-08 LAB — CBC
HCT: 40.5 % (ref 36.0–46.0)
Hemoglobin: 14.1 g/dL (ref 12.0–15.0)
MCH: 31.7 pg (ref 26.0–34.0)
MCHC: 34.8 g/dL (ref 30.0–36.0)
MCV: 91 fL (ref 80.0–100.0)
Platelets: 354 10*3/uL (ref 150–400)
RBC: 4.45 MIL/uL (ref 3.87–5.11)
RDW: 12.3 % (ref 11.5–15.5)
WBC: 8.2 10*3/uL (ref 4.0–10.5)
nRBC: 0 % (ref 0.0–0.2)

## 2020-08-08 LAB — URINALYSIS, ROUTINE W REFLEX MICROSCOPIC
Bilirubin Urine: NEGATIVE
Glucose, UA: NEGATIVE mg/dL
Hgb urine dipstick: NEGATIVE
Ketones, ur: NEGATIVE mg/dL
Nitrite: NEGATIVE
Protein, ur: NEGATIVE mg/dL
Specific Gravity, Urine: 1.015 (ref 1.005–1.030)
pH: 7.5 (ref 5.0–8.0)

## 2020-08-08 LAB — BASIC METABOLIC PANEL
Anion gap: 8 (ref 5–15)
BUN: 14 mg/dL (ref 8–23)
CO2: 22 mmol/L (ref 22–32)
Calcium: 9.8 mg/dL (ref 8.9–10.3)
Chloride: 110 mmol/L (ref 98–111)
Creatinine, Ser: 0.77 mg/dL (ref 0.44–1.00)
GFR, Estimated: >60 mL/min (ref >60)
Glucose, Bld: 112 mg/dL — ABNORMAL HIGH (ref 70–99)
Potassium: 3.9 mmol/L (ref 3.5–5.1)
Sodium: 140 mmol/L (ref 135–145)

## 2020-08-08 LAB — URINALYSIS, MICROSCOPIC (REFLEX)

## 2020-08-08 MED ORDER — MECLIZINE HCL 25 MG PO TABS
25.0000 mg | ORAL_TABLET | Freq: Once | ORAL | Status: AC
  Administered 2020-08-08: 25 mg via ORAL
  Filled 2020-08-08: qty 1

## 2020-08-08 MED ORDER — SODIUM CHLORIDE 0.9 % IV BOLUS
500.0000 mL | Freq: Once | INTRAVENOUS | Status: AC
  Administered 2020-08-08: 500 mL via INTRAVENOUS

## 2020-08-08 MED ORDER — ONDANSETRON 4 MG PO TBDP
4.0000 mg | ORAL_TABLET | Freq: Once | ORAL | Status: AC
  Administered 2020-08-08: 4 mg via ORAL
  Filled 2020-08-08: qty 1

## 2020-08-08 MED ORDER — LORAZEPAM 2 MG/ML IJ SOLN
0.5000 mg | Freq: Once | INTRAMUSCULAR | Status: AC
  Administered 2020-08-08: 0.5 mg via INTRAVENOUS
  Filled 2020-08-08: qty 1

## 2020-08-08 NOTE — ED Triage Notes (Signed)
Pt presents with complaints of worsening dizziness that started last night. Today, she started having intermittent vomiting episodes and weakness. Pt ambulatory to triage and in NAD.

## 2020-08-08 NOTE — ED Notes (Signed)
Pt with dizziness, described as room spinning since last night with vomiting. Sinus congestion past several days.
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
## 2020-08-09 MED ORDER — MECLIZINE HCL 25 MG PO TABS: 25.0000 mg | ORAL_TABLET | Freq: Three times a day (TID) | ORAL | 0 refills | Status: DC | PRN

## 2020-08-09 NOTE — ED Notes (Signed)
Pt walked with one person assist. Pt was slightly wobbly but stated she felt better than she was earlier.

## 2020-08-11 DIAGNOSIS — H8112 Benign paroxysmal vertigo, left ear: Secondary | ICD-10-CM | POA: Diagnosis not present

## 2020-10-30 ENCOUNTER — Encounter: Payer: Self-pay | Admitting: Nurse Practitioner

## 2020-10-30 ENCOUNTER — Ambulatory Visit (INDEPENDENT_AMBULATORY_CARE_PROVIDER_SITE_OTHER): Payer: BC Managed Care – PPO | Admitting: Nurse Practitioner

## 2020-10-30 ENCOUNTER — Telehealth: Payer: Self-pay

## 2020-10-30 ENCOUNTER — Other Ambulatory Visit: Payer: Self-pay

## 2020-10-30 VITALS — BP 133/84 | HR 67 | Temp 97.9°F | Ht 65.0 in | Wt 160.0 lb

## 2020-10-30 DIAGNOSIS — J0140 Acute pansinusitis, unspecified: Secondary | ICD-10-CM | POA: Diagnosis not present

## 2020-10-30 MED ORDER — AZITHROMYCIN 250 MG PO TABS: ORAL_TABLET | ORAL | 0 refills | Status: DC

## 2020-10-30 NOTE — Telephone Encounter (Signed)
Please add patient to [facility] today at 115pm. AS, CMA

## 2020-10-30 NOTE — Patient Instructions (Signed)

## 2020-10-30 NOTE — Telephone Encounter (Signed)
Patient states she possibly has a sinus infection. She has congestion and sinus drainage, and discharge from nose. Patient has had negative COVID test. She is a former [employer] patient. Please advise, thanks.
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
## 2020-10-30 NOTE — Telephone Encounter (Signed)
Nasal congestion, facial pressure, colored drainage. Pt has had 3 negative covid tests in the last week. Symptoms started 7 days ago.

## 2021-05-11 DIAGNOSIS — Z1231 Encounter for screening mammogram for malignant neoplasm of breast: Secondary | ICD-10-CM | POA: Diagnosis not present

## 2021-07-29 DIAGNOSIS — Z0000 Encounter for general adult medical examination without abnormal findings: Secondary | ICD-10-CM | POA: Diagnosis not present

## 2021-07-29 DIAGNOSIS — Z13220 Encounter for screening for lipoid disorders: Secondary | ICD-10-CM | POA: Diagnosis not present

## 2021-09-04 ENCOUNTER — Encounter: Payer: Self-pay | Admitting: Nurse Practitioner

## 2021-09-04 ENCOUNTER — Ambulatory Visit (INDEPENDENT_AMBULATORY_CARE_PROVIDER_SITE_OTHER): Payer: BC Managed Care – PPO | Admitting: Nurse Practitioner

## 2021-09-04 ENCOUNTER — Other Ambulatory Visit: Payer: Self-pay

## 2021-09-04 VITALS — BP 105/70 | HR 83 | Temp 98.3°F | Ht 65.0 in | Wt 159.7 lb

## 2021-09-04 DIAGNOSIS — Z6826 Body mass index (BMI) 26.0-26.9, adult: Secondary | ICD-10-CM | POA: Diagnosis not present

## 2021-09-04 DIAGNOSIS — J0140 Acute pansinusitis, unspecified: Secondary | ICD-10-CM

## 2021-09-04 LAB — POCT RAPID STREP A (OFFICE): Rapid Strep A Screen: NEGATIVE

## 2021-09-04 LAB — POCT INFLUENZA A/B
Influenza A, POC: NEGATIVE
Influenza B, POC: NEGATIVE

## 2021-09-04 MED ORDER — AZITHROMYCIN 250 MG PO TABS: ORAL_TABLET | ORAL | 0 refills | Status: DC
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
## 2021-09-10 DIAGNOSIS — Z6826 Body mass index (BMI) 26.0-26.9, adult: Secondary | ICD-10-CM | POA: Insufficient documentation

## 2021-10-07 ENCOUNTER — Ambulatory Visit: Payer: BLUE CROSS/BLUE SHIELD | Admitting: Radiology

## 2021-10-21 ENCOUNTER — Ambulatory Visit: Payer: Self-pay | Admitting: Radiology

## 2021-11-26 DIAGNOSIS — D124 Benign neoplasm of descending colon: Secondary | ICD-10-CM | POA: Diagnosis not present

## 2021-11-26 DIAGNOSIS — K5730 Diverticulosis of large intestine without perforation or abscess without bleeding: Secondary | ICD-10-CM | POA: Diagnosis not present

## 2021-11-26 DIAGNOSIS — Z800 Family history of malignant neoplasm of digestive organs: Secondary | ICD-10-CM | POA: Diagnosis not present

## 2021-11-26 DIAGNOSIS — Z1211 Encounter for screening for malignant neoplasm of colon: Secondary | ICD-10-CM | POA: Diagnosis not present

## 2021-11-26 DIAGNOSIS — D120 Benign neoplasm of cecum: Secondary | ICD-10-CM | POA: Diagnosis not present

## 2021-11-26 DIAGNOSIS — K648 Other hemorrhoids: Secondary | ICD-10-CM | POA: Diagnosis not present

## 2021-12-08 ENCOUNTER — Ambulatory Visit: Payer: Self-pay | Admitting: Radiology

## 2022-01-06 ENCOUNTER — Encounter: Payer: Self-pay | Admitting: Radiology

## 2022-01-06 ENCOUNTER — Ambulatory Visit (INDEPENDENT_AMBULATORY_CARE_PROVIDER_SITE_OTHER): Payer: BC Managed Care – PPO | Admitting: Radiology

## 2022-01-06 ENCOUNTER — Other Ambulatory Visit (HOSPITAL_COMMUNITY)
Admission: RE | Admit: 2022-01-06 | Discharge: 2022-01-06 | Disposition: A | Discharge status: Home / Self Care | Payer: BC Managed Care – PPO | Source: Ambulatory Visit | Attending: Radiology | Admitting: Radiology

## 2022-01-06 VITALS — BP 126/82 | Ht 66.0 in | Wt 166.0 lb

## 2022-01-06 DIAGNOSIS — Z01419 Encounter for gynecological examination (general) (routine) without abnormal findings: Secondary | ICD-10-CM | POA: Diagnosis not present

## 2022-01-06 DIAGNOSIS — Z13820 Encounter for screening for osteoporosis: Secondary | ICD-10-CM

## 2022-01-06 NOTE — Progress Notes (Signed)
? ?Kayla Rogers [birth year] [id number] ? ? ?History: Postmenopausal 66 y.o. G6P4 presents for annual exam. No gyn concerns. Does find she wakes at 1:30 every morning. Otherwise doing well. Has had a hard year, husband diagnosed with kidney cancer after visiting the ER for a PE. Mother died, brother died of a stroke, sister was diagnosed with dementia and is now in a nursing home, other sister was diagnosed with breast cancer.  ? ? ?Gynecologic History ?Postmenopausal ?Last Pap: 2016. Results were: normal ?Last mammogram: 2022. Results were: normal ?Last colonoscopy: 2023- polyps, repeat 3 years- strong family hx of colon CA ?HRT use: never ? ?Obstetric History ?OB History  ?Gravida Para Term Preterm AB Living  ?6 4     2 4   ?SAB IAB Ectopic Multiple Live Births  ?2       4  ?  ?# Outcome Date GA Lbr Len/2nd Weight Sex Delivery Anes PTL Lv  ?6 SAB           ?5 SAB           ?4 Para           ?3 Para           ?2 Para           ?1 Para           ? ? ? ?The following portions of the patient's history were reviewed and updated as appropriate: allergies, current medications, past family history, past medical history, past social history, past surgical history, and problem list. ? ?Review of Systems ?Pertinent items noted in HPI and remainder of comprehensive ROS otherwise negative.  ?Past medical history, past surgical history, family history and social history were all reviewed and documented in the EPIC chart. ? ?Exam: ? ?Vitals:  ? 01/06/22 0810  ?BP: 126/82  ?Weight: 166 lb (75.3 kg)  ?Height: 5\' 6"  (1.676 m)  ? ?Body mass index is 26.79 kg/m?. ? ?General appearance:  Normal ?Thyroid:  Symmetrical, normal in size, without palpable masses or nodularity. ?Respiratory ? Auscultation:  Clear without wheezing or rhonchi ?Cardiovascular ? Auscultation:  Regular rate, without rubs, murmurs or gallops ? Edema/varicosities:  Not grossly evident ?Abdominal ? Soft,nontender, without masses, guarding or rebound. ? Liver/spleen:   No organomegaly noted ? Hernia:  None appreciated ? Skin ? Inspection:  Grossly normal ?Breasts: Examined lying and sitting. Implants ? Right: Without masses, retractions, nipple discharge or axillary adenopathy. ? ? Left: Without masses, retractions, nipple discharge or axillary adenopathy. ?Genitourinary  ? Inguinal/mons:  Normal without inguinal adenopathy ? External genitalia:  Normal appearing vulva with no masses, tenderness, or lesions ? BUS/Urethra/Skene's glands:  Normal ? Vagina:  Normal appearing with normal color and discharge, no lesions. Atrophy: mild  ? Cervix:  Normal appearing without discharge or lesions ? Uterus:  Normal in size, shape and contour.  Midline and mobile, nontender ? Adnexa/parametria:   ?  Rt: Normal in size, without masses or tenderness. ?  Lt: Normal in size, without masses or tenderness. ? Anus and perineum: Normal ?  ? ?Patient informed chaperone available to be present for breast and pelvic exam. Patient has requested no chaperone to be present. Patient has been advised what will be completed during breast and pelvic exam.  ? ?Assessment/Plan:   ?1. Well woman exam with routine gynecological exam ?-schedule mammo ?-colonoscopy up to date ?-magnesium glycinate 400mg  nightly to help with sleep ?- Cytology - PAP( [place]) ? ?2. Screening for osteoporosis ? ?-  DG Bone Density; Future ?  ? ?Discussed SBE, colonoscopy and DEXA screening as directed. Recommend of exercise weekly, including weight bearing exercise. Encouraged the use of seatbelts and sunscreen.  ?Return in 1 year for annual or sooner prn. ? ?Tanda Rockers WHNP-BC, 8:36 AM 01/06/2022  ?

## 2022-01-08 LAB — CYTOLOGY - PAP
Comment: NEGATIVE
Diagnosis: NEGATIVE
High risk HPV: NEGATIVE

## 2022-02-02 ENCOUNTER — Ambulatory Visit (INDEPENDENT_AMBULATORY_CARE_PROVIDER_SITE_OTHER): Payer: BC Managed Care – PPO

## 2022-02-02 ENCOUNTER — Other Ambulatory Visit: Payer: Self-pay | Admitting: Radiology

## 2022-02-02 DIAGNOSIS — M85851 Other specified disorders of bone density and structure, right thigh: Secondary | ICD-10-CM

## 2022-02-02 DIAGNOSIS — Z13820 Encounter for screening for osteoporosis: Secondary | ICD-10-CM

## 2022-02-02 DIAGNOSIS — M85852 Other specified disorders of bone density and structure, left thigh: Secondary | ICD-10-CM | POA: Diagnosis not present

## 2022-02-02 DIAGNOSIS — Z780 Asymptomatic menopausal state: Secondary | ICD-10-CM | POA: Diagnosis not present

## 2022-05-11 ENCOUNTER — Encounter: Payer: Self-pay | Admitting: Radiology

## 2022-05-11 ENCOUNTER — Telehealth: Payer: Self-pay | Admitting: *Deleted

## 2022-05-11 ENCOUNTER — Ambulatory Visit (INDEPENDENT_AMBULATORY_CARE_PROVIDER_SITE_OTHER): Payer: BC Managed Care – PPO | Admitting: Radiology

## 2022-05-11 VITALS — BP 136/98

## 2022-05-11 DIAGNOSIS — L309 Dermatitis, unspecified: Secondary | ICD-10-CM

## 2022-05-11 MED ORDER — NYSTATIN-TRIAMCINOLONE 100000-0.1 UNIT/GM-% EX OINT: 1.0000 | TOPICAL_OINTMENT | Freq: Two times a day (BID) | CUTANEOUS | 0 refills | Status: DC

## 2022-05-11 NOTE — Progress Notes (Signed)
   Kayla Rogers [birth year] [id number]   History:  66 y.o. c/o left breast nipple redness, itching and scaling x ~40months. Sx much improved. Has mammo scheduled this week at [facility]. No masses, tenderness or other breast abnormalities of either breast per pt.   Gynecologic History Patient's last menstrual period was 12/22/2011.  Last mammogram: 2022. Results were: normal. Mammo scheduled 05/14/22  Obstetric History OB History  Gravida Para Term Preterm AB Living  6 4     2 4   SAB IAB Ectopic Multiple Live Births  2       4    # Outcome Date GA Lbr Len/2nd Weight Sex Delivery Anes PTL Lv  6 SAB           5 SAB           4 Para           3 Para           2 Para           1 Para              The following portions of the patient's history were reviewed and updated as appropriate: allergies, current medications, past family history, past medical history, past social history, past surgical history, and problem list.  Review of Systems Pertinent items noted in HPI and remainder of comprehensive ROS otherwise negative.   Past medical history, past surgical history, family history and social history were all reviewed and documented in the EPIC chart.   Exam:  Vitals:   05/11/22 1402  BP: (!) 136/98   There is no height or weight on file to calculate BMI.  General appearance:  Normal Breasts: breasts appear normal, no suspicious masses, no skin changes or axillary nodes bilaterally. The left nipple is slightly erythematous with some scaling of the skin. No discharge, no areolar involvement of other skin involvement.   Patient informed chaperone available to be present for breast exam. Patient has requested no chaperone to be present.   Assessment/Plan:   1. Nipple dermatitis Likely fungal, keep appt with [facility] for screening mammogram.  - nystatin-triamcinolone ointment (MYCOLOG); Apply 1 Application topically 2 (two) times daily.  Dispense: 30 g; Refill: 0    Kayla Rogers,  Kayla Rogers WHNP-BC 2:12 PM 05/11/2022

## 2022-05-11 NOTE — Telephone Encounter (Signed)
[facility] sent faxed stating nystatin-triamcinolone ointment needs prior authorization. PA done via cover my meds, pending response from [facility]

## 2022-05-13 DIAGNOSIS — Z1231 Encounter for screening mammogram for malignant neoplasm of breast: Secondary | ICD-10-CM | POA: Diagnosis not present

## 2022-05-13 NOTE — Telephone Encounter (Signed)
Ok to send

## 2022-05-13 NOTE — Telephone Encounter (Signed)
nystatin-triamcinolone ointment denied by [facility]. I noticed if insurance won't pay nystatin-triamcinolone ointment they pay for Rx's separated and it cheaper. Okay to send?

## 2022-05-13 NOTE — Telephone Encounter (Signed)
Called patient and she picked up Rx already it was affordable for her. No need to send in new Rx.

## 2022-07-09 DIAGNOSIS — S92502A Displaced unspecified fracture of left lesser toe(s), initial encounter for closed fracture: Secondary | ICD-10-CM | POA: Diagnosis not present

## 2022-07-09 DIAGNOSIS — J0141 Acute recurrent pansinusitis: Secondary | ICD-10-CM | POA: Diagnosis not present

## 2022-07-09 DIAGNOSIS — W19XXXA Unspecified fall, initial encounter: Secondary | ICD-10-CM | POA: Diagnosis not present

## 2022-07-09 DIAGNOSIS — M79672 Pain in left foot: Secondary | ICD-10-CM | POA: Diagnosis not present

## 2022-07-09 DIAGNOSIS — M25512 Pain in left shoulder: Secondary | ICD-10-CM | POA: Diagnosis not present

## 2022-07-19 ENCOUNTER — Encounter: Payer: Self-pay | Admitting: Radiology

## 2022-07-22 ENCOUNTER — Telehealth: Payer: Self-pay

## 2022-07-22 ENCOUNTER — Ambulatory Visit (INDEPENDENT_AMBULATORY_CARE_PROVIDER_SITE_OTHER): Payer: BC Managed Care – PPO | Admitting: Radiology

## 2022-07-22 VITALS — BP 124/82 | Temp 97.6°F

## 2022-07-22 DIAGNOSIS — N6459 Other signs and symptoms in breast: Secondary | ICD-10-CM

## 2022-07-22 DIAGNOSIS — N649 Disorder of breast, unspecified: Secondary | ICD-10-CM | POA: Diagnosis not present

## 2022-07-22 DIAGNOSIS — L309 Dermatitis, unspecified: Secondary | ICD-10-CM

## 2022-07-22 NOTE — Telephone Encounter (Signed)
Yes, that's fine 

## 2022-07-22 NOTE — Telephone Encounter (Signed)
Order amended and re-faxed to [facility], [facility] stated that they will give pt a call to schedule appt.   Pt informed and voiced understanding.

## 2022-07-22 NOTE — Telephone Encounter (Signed)
-----   Message from Tanda Rockers, NP sent at 07/22/2022  8:29 AM EST ----- Regarding: Diagnostic mammo Please schedule for diagnostic mammo [facility]) left nipple changes

## 2022-07-22 NOTE — Telephone Encounter (Signed)
Faxed order successfully.   Per rep @ [facility], stated that they dont specifically consider skin changes on the breast a problem for diagnostic imaging. They would say for pt to see dermatologist since no nipple discharge was noted.   I was placed on hold and then rep came back stating that she was going to transfer me to center director and if they did not answer, then to leave VM.   Spoke with [employer] promotion account executive @ [facility] [place]). She was made aware that the pt has been c/o these sxs since before her screening mammo and was given an ointment for treatment that did not resolve problem.Florentina Addison stated that since pt's screening was WNL, the pt can come in to their location for diagnostic testing but they probably wont perform a diagnostic mammo but either an [place] or their provider will perform an exam at the time of her visit.   Katie also provided [place] with a code to use for the imaging. (N61-inflammatory issues of the breast).  May I addend the order/codes and refax? Please advise.

## 2022-07-22 NOTE — Progress Notes (Signed)
   Kayla Rogers Jan 10, 1956 [id number]   History:  66 y.o. here for f/u left breast nipple redness, itching and scaling x ~17months. Sx no longer improving. Had normal screening mammo scheduled September 21 at [place]. No masses, tenderness or other breast abnormalities of either breast per pt.   Gynecologic History Patient's last menstrual period was 12/22/2011.  Last mammogram: 2022. Results were: normal. Mammo scheduled 05/14/22  Obstetric History OB History  Gravida Para Term Preterm AB Living  6 4     2 4   SAB IAB Ectopic Multiple Live Births  2       4    # Outcome Date GA Lbr Len/2nd Weight Sex Delivery Anes PTL Lv  6 SAB           5 SAB           4 Para           3 Para           2 Para           1 Para              The following portions of the patient's history were reviewed and updated as appropriate: allergies, current medications, past family history, past medical history, past social history, past surgical history, and problem list.  Review of Systems Pertinent items noted in HPI and remainder of comprehensive ROS otherwise negative.   Past medical history, past surgical history, family history and social history were all reviewed and documented in the EPIC chart.   Exam:  Vitals:   07/22/22 0809  BP: 124/82  Temp: 97.6 F (36.4 C)  TempSrc: Oral   There is no height or weight on file to calculate BMI.  General appearance:  Normal Breasts: breasts appear normal, no suspicious masses, no skin changes or axillary nodes bilaterally. The left nipple is slightly erythematous with some scaling of the skin. No discharge, no areolar involvement of other skin involvement.   Patient informed chaperone available to be present for breast exam. Patient has requested no chaperone to be present.   Assessment/Plan:   1. Nipple problem No improvement after Mycolog ointment, Will send for diagnostic mammo and u/s  07/24/22 B WHNP-BC 8:26 AM 07/22/2022

## 2022-08-02 DIAGNOSIS — N6042 Mammary duct ectasia of left breast: Secondary | ICD-10-CM | POA: Diagnosis not present

## 2022-08-03 ENCOUNTER — Encounter: Payer: Self-pay | Admitting: Radiology

## 2022-08-05 ENCOUNTER — Telehealth: Payer: Self-pay | Admitting: *Deleted

## 2022-08-05 DIAGNOSIS — L309 Dermatitis, unspecified: Secondary | ICD-10-CM

## 2022-08-05 NOTE — Telephone Encounter (Signed)
Imaging done on 08/02/22. Results received. Results/recommendation relayed to pt by radiologist @ [facility]. Will close encounter.

## 2022-08-05 NOTE — Telephone Encounter (Signed)
Patient left voicemail on Triage line requesting to speak with a nurse regarding recent breast imaging.   Returned call to patient. Patient states she had breast imaging done this week on left breast due to nipple scaling and redness. Tried mycolog cream prescribed by Clearnce Hasten, NP with no relief. Patient states that [facility] recommended she schedule an appointment with [facility] to try a different cream. If that didn't work would likely need a biopsy to rule out Padgett's disease per patient. Patient states she does not want to try another cream, would just like to proceed with biopsy to know what is going on. RN advised would need to review with Clearnce Hasten, NP and return call with recommendations. Patient agreeable.   Routing to provider for review.

## 2022-08-06 DIAGNOSIS — S92515A Nondisplaced fracture of proximal phalanx of left lesser toe(s), initial encounter for closed fracture: Secondary | ICD-10-CM | POA: Diagnosis not present

## 2022-08-06 DIAGNOSIS — M79672 Pain in left foot: Secondary | ICD-10-CM | POA: Diagnosis not present

## 2022-08-10 NOTE — Telephone Encounter (Signed)
Please refer to dermatology for biopsy

## 2022-08-10 NOTE — Telephone Encounter (Signed)
Patient called back because she did not hear from the office. I informed patient with the below. I told her I will try to find her a new patient appointment sooner than later and call her back.

## 2022-08-11 NOTE — Telephone Encounter (Signed)
Patient informed, referral placed at [facility] dermatology in [place]. Patient said she is fine with this location.  They will call to schedule.

## 2022-08-12 NOTE — Telephone Encounter (Signed)
Pt calling to report that she wasn't able to get an appt w/ the derm referral at [facility] in [place] until 06/2023. Ok to send referral to [facility] to see if she can get a sooner appt with them? Please advise. Thanks.

## 2022-08-12 NOTE — Telephone Encounter (Signed)
Referral faxed successfully.  

## 2022-08-12 NOTE — Telephone Encounter (Signed)
[facility] derm is booking about 6 months for new patients. Please refer to Dr Okey Regal office in [place]. Thanks.

## 2022-08-18 NOTE — Telephone Encounter (Signed)
Per Deanna Artis from [facility] location reports they have received referral they just need to contact pt.

## 2022-09-17 DIAGNOSIS — N6489 Other specified disorders of breast: Secondary | ICD-10-CM | POA: Diagnosis not present

## 2022-09-17 DIAGNOSIS — L03114 Cellulitis of left upper limb: Secondary | ICD-10-CM | POA: Diagnosis not present

## 2022-09-17 DIAGNOSIS — R590 Localized enlarged lymph nodes: Secondary | ICD-10-CM | POA: Diagnosis not present

## 2022-09-21 DIAGNOSIS — C50012 Malignant neoplasm of nipple and areola, left female breast: Secondary | ICD-10-CM | POA: Diagnosis not present

## 2022-09-21 DIAGNOSIS — C50019 Malignant neoplasm of nipple and areola, unspecified female breast: Secondary | ICD-10-CM | POA: Diagnosis not present

## 2022-10-05 DIAGNOSIS — R92333 Mammographic heterogeneous density, bilateral breasts: Secondary | ICD-10-CM | POA: Diagnosis not present

## 2022-10-05 DIAGNOSIS — N644 Mastodynia: Secondary | ICD-10-CM | POA: Diagnosis not present

## 2022-10-06 DIAGNOSIS — C50012 Malignant neoplasm of nipple and areola, left female breast: Secondary | ICD-10-CM | POA: Diagnosis not present

## 2022-10-06 DIAGNOSIS — Z9889 Other specified postprocedural states: Secondary | ICD-10-CM | POA: Diagnosis not present

## 2022-10-06 DIAGNOSIS — Z803 Family history of malignant neoplasm of breast: Secondary | ICD-10-CM | POA: Diagnosis not present

## 2022-10-12 DIAGNOSIS — R590 Localized enlarged lymph nodes: Secondary | ICD-10-CM | POA: Diagnosis not present

## 2022-10-12 DIAGNOSIS — Z9882 Breast implant status: Secondary | ICD-10-CM | POA: Diagnosis not present

## 2022-10-12 DIAGNOSIS — C50012 Malignant neoplasm of nipple and areola, left female breast: Secondary | ICD-10-CM | POA: Diagnosis not present

## 2022-10-12 DIAGNOSIS — D0512 Intraductal carcinoma in situ of left breast: Secondary | ICD-10-CM | POA: Insufficient documentation

## 2022-10-13 DIAGNOSIS — Z880 Allergy status to penicillin: Secondary | ICD-10-CM | POA: Diagnosis not present

## 2022-10-13 DIAGNOSIS — R590 Localized enlarged lymph nodes: Secondary | ICD-10-CM | POA: Diagnosis not present

## 2022-10-13 DIAGNOSIS — C50012 Malignant neoplasm of nipple and areola, left female breast: Secondary | ICD-10-CM | POA: Diagnosis not present

## 2022-10-13 DIAGNOSIS — C50912 Malignant neoplasm of unspecified site of left female breast: Secondary | ICD-10-CM | POA: Diagnosis not present

## 2022-10-13 DIAGNOSIS — N649 Disorder of breast, unspecified: Secondary | ICD-10-CM | POA: Diagnosis not present

## 2022-10-14 DIAGNOSIS — C50012 Malignant neoplasm of nipple and areola, left female breast: Secondary | ICD-10-CM | POA: Diagnosis not present

## 2022-10-14 DIAGNOSIS — R928 Other abnormal and inconclusive findings on diagnostic imaging of breast: Secondary | ICD-10-CM | POA: Diagnosis not present

## 2022-10-14 DIAGNOSIS — Z9889 Other specified postprocedural states: Secondary | ICD-10-CM | POA: Diagnosis not present

## 2022-10-14 DIAGNOSIS — N6311 Unspecified lump in the right breast, upper outer quadrant: Secondary | ICD-10-CM | POA: Diagnosis not present

## 2022-10-14 DIAGNOSIS — R590 Localized enlarged lymph nodes: Secondary | ICD-10-CM | POA: Diagnosis not present

## 2022-10-14 DIAGNOSIS — C50912 Malignant neoplasm of unspecified site of left female breast: Secondary | ICD-10-CM | POA: Diagnosis not present

## 2022-10-15 DIAGNOSIS — C50912 Malignant neoplasm of unspecified site of left female breast: Secondary | ICD-10-CM | POA: Diagnosis not present

## 2022-10-15 DIAGNOSIS — R590 Localized enlarged lymph nodes: Secondary | ICD-10-CM | POA: Diagnosis not present

## 2022-10-18 DIAGNOSIS — I898 Other specified noninfective disorders of lymphatic vessels and lymph nodes: Secondary | ICD-10-CM | POA: Diagnosis not present

## 2022-10-18 DIAGNOSIS — R2232 Localized swelling, mass and lump, left upper limb: Secondary | ICD-10-CM | POA: Diagnosis not present

## 2022-10-18 DIAGNOSIS — R590 Localized enlarged lymph nodes: Secondary | ICD-10-CM | POA: Diagnosis not present

## 2022-10-18 DIAGNOSIS — R9389 Abnormal findings on diagnostic imaging of other specified body structures: Secondary | ICD-10-CM | POA: Diagnosis not present

## 2022-10-18 DIAGNOSIS — N6489 Other specified disorders of breast: Secondary | ICD-10-CM | POA: Diagnosis not present

## 2022-10-21 DIAGNOSIS — D485 Neoplasm of uncertain behavior of skin: Secondary | ICD-10-CM | POA: Diagnosis not present

## 2022-10-25 DIAGNOSIS — C50012 Malignant neoplasm of nipple and areola, left female breast: Secondary | ICD-10-CM | POA: Diagnosis not present

## 2022-11-15 DIAGNOSIS — C50012 Malignant neoplasm of nipple and areola, left female breast: Secondary | ICD-10-CM | POA: Diagnosis not present

## 2022-12-08 DIAGNOSIS — Z45812 Encounter for adjustment or removal of left breast implant: Secondary | ICD-10-CM | POA: Diagnosis not present

## 2022-12-08 DIAGNOSIS — Z45811 Encounter for adjustment or removal of right breast implant: Secondary | ICD-10-CM | POA: Diagnosis not present

## 2022-12-08 DIAGNOSIS — T8544XA Capsular contracture of breast implant, initial encounter: Secondary | ICD-10-CM | POA: Diagnosis not present

## 2022-12-08 DIAGNOSIS — D0512 Intraductal carcinoma in situ of left breast: Secondary | ICD-10-CM | POA: Diagnosis not present

## 2022-12-08 DIAGNOSIS — C50012 Malignant neoplasm of nipple and areola, left female breast: Secondary | ICD-10-CM | POA: Diagnosis not present

## 2022-12-08 DIAGNOSIS — Z79899 Other long term (current) drug therapy: Secondary | ICD-10-CM | POA: Diagnosis not present

## 2022-12-08 DIAGNOSIS — Z880 Allergy status to penicillin: Secondary | ICD-10-CM | POA: Diagnosis not present

## 2022-12-08 HISTORY — PX: AUGMENTATION MAMMAPLASTY: SUR837

## 2022-12-20 DIAGNOSIS — D0512 Intraductal carcinoma in situ of left breast: Secondary | ICD-10-CM | POA: Diagnosis not present

## 2022-12-20 DIAGNOSIS — N6001 Solitary cyst of right breast: Secondary | ICD-10-CM | POA: Diagnosis not present

## 2022-12-30 DIAGNOSIS — Z800 Family history of malignant neoplasm of digestive organs: Secondary | ICD-10-CM | POA: Diagnosis not present

## 2022-12-30 DIAGNOSIS — Z880 Allergy status to penicillin: Secondary | ICD-10-CM | POA: Diagnosis not present

## 2022-12-30 DIAGNOSIS — D0512 Intraductal carcinoma in situ of left breast: Secondary | ICD-10-CM | POA: Diagnosis not present

## 2022-12-30 DIAGNOSIS — Z801 Family history of malignant neoplasm of trachea, bronchus and lung: Secondary | ICD-10-CM | POA: Diagnosis not present

## 2022-12-30 DIAGNOSIS — Z803 Family history of malignant neoplasm of breast: Secondary | ICD-10-CM | POA: Diagnosis not present

## 2022-12-30 DIAGNOSIS — Z9012 Acquired absence of left breast and nipple: Secondary | ICD-10-CM | POA: Diagnosis not present

## 2023-01-03 DIAGNOSIS — D0512 Intraductal carcinoma in situ of left breast: Secondary | ICD-10-CM | POA: Diagnosis not present

## 2023-01-12 DIAGNOSIS — D0512 Intraductal carcinoma in situ of left breast: Secondary | ICD-10-CM | POA: Diagnosis not present

## 2023-01-13 DIAGNOSIS — Z801 Family history of malignant neoplasm of trachea, bronchus and lung: Secondary | ICD-10-CM | POA: Diagnosis not present

## 2023-01-13 DIAGNOSIS — D0512 Intraductal carcinoma in situ of left breast: Secondary | ICD-10-CM | POA: Diagnosis not present

## 2023-01-13 DIAGNOSIS — Z9012 Acquired absence of left breast and nipple: Secondary | ICD-10-CM | POA: Diagnosis not present

## 2023-01-13 DIAGNOSIS — Z800 Family history of malignant neoplasm of digestive organs: Secondary | ICD-10-CM | POA: Diagnosis not present

## 2023-01-13 DIAGNOSIS — Z880 Allergy status to penicillin: Secondary | ICD-10-CM | POA: Diagnosis not present

## 2023-01-13 DIAGNOSIS — Z803 Family history of malignant neoplasm of breast: Secondary | ICD-10-CM | POA: Diagnosis not present

## 2023-01-21 DIAGNOSIS — S63633A Sprain of interphalangeal joint of left middle finger, initial encounter: Secondary | ICD-10-CM | POA: Diagnosis not present

## 2023-01-21 DIAGNOSIS — M79642 Pain in left hand: Secondary | ICD-10-CM | POA: Diagnosis not present

## 2023-01-25 DIAGNOSIS — Z880 Allergy status to penicillin: Secondary | ICD-10-CM | POA: Diagnosis not present

## 2023-01-25 DIAGNOSIS — C50912 Malignant neoplasm of unspecified site of left female breast: Secondary | ICD-10-CM | POA: Diagnosis not present

## 2023-01-25 DIAGNOSIS — R590 Localized enlarged lymph nodes: Secondary | ICD-10-CM | POA: Diagnosis not present

## 2023-01-25 DIAGNOSIS — D0512 Intraductal carcinoma in situ of left breast: Secondary | ICD-10-CM | POA: Diagnosis not present

## 2023-01-25 DIAGNOSIS — N649 Disorder of breast, unspecified: Secondary | ICD-10-CM | POA: Diagnosis not present

## 2023-01-27 DIAGNOSIS — N649 Disorder of breast, unspecified: Secondary | ICD-10-CM | POA: Diagnosis not present

## 2023-01-27 DIAGNOSIS — R590 Localized enlarged lymph nodes: Secondary | ICD-10-CM | POA: Diagnosis not present

## 2023-01-27 DIAGNOSIS — C50912 Malignant neoplasm of unspecified site of left female breast: Secondary | ICD-10-CM | POA: Diagnosis not present

## 2023-01-27 DIAGNOSIS — Z880 Allergy status to penicillin: Secondary | ICD-10-CM | POA: Diagnosis not present

## 2023-01-27 DIAGNOSIS — D0512 Intraductal carcinoma in situ of left breast: Secondary | ICD-10-CM | POA: Diagnosis not present

## 2023-01-28 DIAGNOSIS — N649 Disorder of breast, unspecified: Secondary | ICD-10-CM | POA: Diagnosis not present

## 2023-01-28 DIAGNOSIS — R590 Localized enlarged lymph nodes: Secondary | ICD-10-CM | POA: Diagnosis not present

## 2023-01-28 DIAGNOSIS — D0512 Intraductal carcinoma in situ of left breast: Secondary | ICD-10-CM | POA: Diagnosis not present

## 2023-01-28 DIAGNOSIS — C50912 Malignant neoplasm of unspecified site of left female breast: Secondary | ICD-10-CM | POA: Diagnosis not present

## 2023-01-28 DIAGNOSIS — Z880 Allergy status to penicillin: Secondary | ICD-10-CM | POA: Diagnosis not present

## 2023-01-31 DIAGNOSIS — C50912 Malignant neoplasm of unspecified site of left female breast: Secondary | ICD-10-CM | POA: Diagnosis not present

## 2023-01-31 DIAGNOSIS — R590 Localized enlarged lymph nodes: Secondary | ICD-10-CM | POA: Diagnosis not present

## 2023-01-31 DIAGNOSIS — N649 Disorder of breast, unspecified: Secondary | ICD-10-CM | POA: Diagnosis not present

## 2023-01-31 DIAGNOSIS — D0512 Intraductal carcinoma in situ of left breast: Secondary | ICD-10-CM | POA: Diagnosis not present

## 2023-01-31 DIAGNOSIS — Z880 Allergy status to penicillin: Secondary | ICD-10-CM | POA: Diagnosis not present

## 2023-02-01 ENCOUNTER — Telehealth: Payer: Self-pay

## 2023-02-01 DIAGNOSIS — D0512 Intraductal carcinoma in situ of left breast: Secondary | ICD-10-CM | POA: Diagnosis not present

## 2023-02-01 DIAGNOSIS — N649 Disorder of breast, unspecified: Secondary | ICD-10-CM | POA: Diagnosis not present

## 2023-02-01 DIAGNOSIS — Z880 Allergy status to penicillin: Secondary | ICD-10-CM | POA: Diagnosis not present

## 2023-02-01 DIAGNOSIS — R590 Localized enlarged lymph nodes: Secondary | ICD-10-CM | POA: Diagnosis not present

## 2023-02-01 DIAGNOSIS — C50912 Malignant neoplasm of unspecified site of left female breast: Secondary | ICD-10-CM | POA: Diagnosis not present

## 2023-02-01 NOTE — Telephone Encounter (Signed)
Pt calling to inquire about upcoming appt scheduled on 02/09/2023 for [facility]. States that since she scheduled the appt, she has been dx'd w/ cancer and has been undergoing radiation treatments and is still ongoing. Pt is inquiring if you feel she should r/s until after radiation is completed or as long as she feels up to coming in for appt, she can come in? Please advise.

## 2023-02-02 DIAGNOSIS — N649 Disorder of breast, unspecified: Secondary | ICD-10-CM | POA: Diagnosis not present

## 2023-02-02 DIAGNOSIS — D0512 Intraductal carcinoma in situ of left breast: Secondary | ICD-10-CM | POA: Diagnosis not present

## 2023-02-02 DIAGNOSIS — C50912 Malignant neoplasm of unspecified site of left female breast: Secondary | ICD-10-CM | POA: Diagnosis not present

## 2023-02-02 DIAGNOSIS — Z880 Allergy status to penicillin: Secondary | ICD-10-CM | POA: Diagnosis not present

## 2023-02-02 DIAGNOSIS — R590 Localized enlarged lymph nodes: Secondary | ICD-10-CM | POA: Diagnosis not present

## 2023-02-02 NOTE — Telephone Encounter (Signed)
If she feels well enough to come in we can keep the scheduled appt. If she would prefer to wait a month or two I am ok with that as well. Please give her my best wishes.

## 2023-02-02 NOTE — Telephone Encounter (Signed)
Pt notified and voiced understanding. Will keep appt. Encounter closed.

## 2023-02-03 DIAGNOSIS — D0512 Intraductal carcinoma in situ of left breast: Secondary | ICD-10-CM | POA: Diagnosis not present

## 2023-02-03 DIAGNOSIS — C50912 Malignant neoplasm of unspecified site of left female breast: Secondary | ICD-10-CM | POA: Diagnosis not present

## 2023-02-03 DIAGNOSIS — N649 Disorder of breast, unspecified: Secondary | ICD-10-CM | POA: Diagnosis not present

## 2023-02-03 DIAGNOSIS — Z880 Allergy status to penicillin: Secondary | ICD-10-CM | POA: Diagnosis not present

## 2023-02-03 DIAGNOSIS — R590 Localized enlarged lymph nodes: Secondary | ICD-10-CM | POA: Diagnosis not present

## 2023-02-04 DIAGNOSIS — Z880 Allergy status to penicillin: Secondary | ICD-10-CM | POA: Diagnosis not present

## 2023-02-04 DIAGNOSIS — N649 Disorder of breast, unspecified: Secondary | ICD-10-CM | POA: Diagnosis not present

## 2023-02-04 DIAGNOSIS — D0512 Intraductal carcinoma in situ of left breast: Secondary | ICD-10-CM | POA: Diagnosis not present

## 2023-02-04 DIAGNOSIS — C50912 Malignant neoplasm of unspecified site of left female breast: Secondary | ICD-10-CM | POA: Diagnosis not present

## 2023-02-04 DIAGNOSIS — R590 Localized enlarged lymph nodes: Secondary | ICD-10-CM | POA: Diagnosis not present

## 2023-02-07 DIAGNOSIS — C50912 Malignant neoplasm of unspecified site of left female breast: Secondary | ICD-10-CM | POA: Diagnosis not present

## 2023-02-07 DIAGNOSIS — D0512 Intraductal carcinoma in situ of left breast: Secondary | ICD-10-CM | POA: Diagnosis not present

## 2023-02-07 DIAGNOSIS — Z880 Allergy status to penicillin: Secondary | ICD-10-CM | POA: Diagnosis not present

## 2023-02-07 DIAGNOSIS — N649 Disorder of breast, unspecified: Secondary | ICD-10-CM | POA: Diagnosis not present

## 2023-02-07 DIAGNOSIS — R590 Localized enlarged lymph nodes: Secondary | ICD-10-CM | POA: Diagnosis not present

## 2023-02-08 DIAGNOSIS — R590 Localized enlarged lymph nodes: Secondary | ICD-10-CM | POA: Diagnosis not present

## 2023-02-08 DIAGNOSIS — C50912 Malignant neoplasm of unspecified site of left female breast: Secondary | ICD-10-CM | POA: Diagnosis not present

## 2023-02-08 DIAGNOSIS — D0512 Intraductal carcinoma in situ of left breast: Secondary | ICD-10-CM | POA: Diagnosis not present

## 2023-02-08 DIAGNOSIS — Z880 Allergy status to penicillin: Secondary | ICD-10-CM | POA: Diagnosis not present

## 2023-02-08 DIAGNOSIS — N649 Disorder of breast, unspecified: Secondary | ICD-10-CM | POA: Diagnosis not present

## 2023-02-09 ENCOUNTER — Ambulatory Visit (INDEPENDENT_AMBULATORY_CARE_PROVIDER_SITE_OTHER): Payer: BC Managed Care – PPO | Admitting: Radiology

## 2023-02-09 ENCOUNTER — Encounter: Payer: Self-pay | Admitting: Radiology

## 2023-02-09 VITALS — BP 134/88 | Ht 65.0 in | Wt 171.0 lb

## 2023-02-09 DIAGNOSIS — D0512 Intraductal carcinoma in situ of left breast: Secondary | ICD-10-CM | POA: Diagnosis not present

## 2023-02-09 DIAGNOSIS — Z880 Allergy status to penicillin: Secondary | ICD-10-CM | POA: Diagnosis not present

## 2023-02-09 DIAGNOSIS — C50912 Malignant neoplasm of unspecified site of left female breast: Secondary | ICD-10-CM | POA: Diagnosis not present

## 2023-02-09 DIAGNOSIS — C50012 Malignant neoplasm of nipple and areola, left female breast: Secondary | ICD-10-CM

## 2023-02-09 DIAGNOSIS — Z01419 Encounter for gynecological examination (general) (routine) without abnormal findings: Secondary | ICD-10-CM | POA: Diagnosis not present

## 2023-02-09 DIAGNOSIS — R590 Localized enlarged lymph nodes: Secondary | ICD-10-CM | POA: Diagnosis not present

## 2023-02-09 DIAGNOSIS — N649 Disorder of breast, unspecified: Secondary | ICD-10-CM | POA: Diagnosis not present

## 2023-02-09 NOTE — Progress Notes (Signed)
   Kayla Rogers [birth year] [id number]   History: Postmenopausal 67 y.o. presents for annual exam. S/p left partial mastectomy, removal of bilateral implants and breast reduction for Pagets carcinoma of the left nipple. Currently receiving radiation at [facility]. Doing well, very supportive family.   Gynecologic History Postmenopausal Last Pap: 2023. Results were: normal Last mammogram: 2023.  [id number] HRT use: no  Obstetric History OB History  Gravida Para Term Preterm AB Living  6 4     2 4   SAB IAB Ectopic Multiple Live Births  2       4    # Outcome Date GA Lbr Len/2nd Weight Sex Delivery Anes PTL Lv  6 SAB           5 SAB           4 Para           3 Para           2 Para           1 Para             Obstetric Comments  Has 8 children (4 are adopted).     The following portions of the patient's history were reviewed and updated as appropriate: allergies, current medications, past family history, past medical history, past social history, past surgical history, and problem list.  Review of Systems Pertinent items noted in HPI and remainder of comprehensive ROS otherwise negative.  Past medical history, past surgical history, family history and social history were all reviewed and documented in the EPIC chart.  Exam:  Vitals:   02/09/23 0800 02/09/23 0807  BP: (!) 136/98 134/88  Weight: 171 lb (77.6 kg)   Height: 5\' 5"  (1.651 m)    Body mass index is 28.46 kg/m.  General appearance:  Normal Thyroid:  Symmetrical, normal in size, without palpable masses or nodularity. Respiratory  Auscultation:  Clear without wheezing or rhonchi Cardiovascular  Auscultation:  Regular rate, without rubs, murmurs or gallops  Edema/varicosities:  Not grossly evident Abdominal  Soft,nontender, without masses, guarding or rebound.  Liver/spleen:  No organomegaly noted  Hernia:  None appreciated  Skin  Inspection:  Grossly normal Breasts: Examined lying and  sitting.   Right: Without masses, retractions, nipple discharge or axillary adenopathy.   Left: Without masses, retractions, nipple discharge or axillary adenopathy. Genitourinary   Inguinal/mons:  Normal without inguinal adenopathy  External genitalia:  Normal appearing vulva with no masses, tenderness, or lesions  BUS/Urethra/Skene's glands:  Normal  Vagina:  Normal appearing with normal color and discharge, no lesions. Atrophy: mild   Cervix:  Normal appearing without discharge or lesions  Uterus:  Normal in size, shape and contour.  Midline and mobile, nontender  Adnexa/parametria:     Rt: Normal in size, without masses or tenderness.   Lt: Normal in size, without masses or tenderness.  Anus and perineum: Normal    Raynelle Fanning, CMA present for exam  Assessment/Plan:   1. Well woman exam with routine gynecological exam Up to date on screenings  2. 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 Recommend of exercise weekly, including weight bearing exercise. Encouraged the use of seatbelts and sunscreen.  Return in 1 year for annual or sooner prn.  Tanda Rockers WHNP-BC, 8:31 AM 02/09/2023

## 2023-02-10 DIAGNOSIS — R590 Localized enlarged lymph nodes: Secondary | ICD-10-CM | POA: Diagnosis not present

## 2023-02-10 DIAGNOSIS — Z880 Allergy status to penicillin: Secondary | ICD-10-CM | POA: Diagnosis not present

## 2023-02-10 DIAGNOSIS — C50912 Malignant neoplasm of unspecified site of left female breast: Secondary | ICD-10-CM | POA: Diagnosis not present

## 2023-02-10 DIAGNOSIS — D0512 Intraductal carcinoma in situ of left breast: Secondary | ICD-10-CM | POA: Diagnosis not present

## 2023-02-10 DIAGNOSIS — N649 Disorder of breast, unspecified: Secondary | ICD-10-CM | POA: Diagnosis not present

## 2023-02-11 DIAGNOSIS — R590 Localized enlarged lymph nodes: Secondary | ICD-10-CM | POA: Diagnosis not present

## 2023-02-11 DIAGNOSIS — Z880 Allergy status to penicillin: Secondary | ICD-10-CM | POA: Diagnosis not present

## 2023-02-11 DIAGNOSIS — C50912 Malignant neoplasm of unspecified site of left female breast: Secondary | ICD-10-CM | POA: Diagnosis not present

## 2023-02-11 DIAGNOSIS — D0512 Intraductal carcinoma in situ of left breast: Secondary | ICD-10-CM | POA: Diagnosis not present

## 2023-02-11 DIAGNOSIS — N649 Disorder of breast, unspecified: Secondary | ICD-10-CM | POA: Diagnosis not present

## 2023-02-14 DIAGNOSIS — N649 Disorder of breast, unspecified: Secondary | ICD-10-CM | POA: Diagnosis not present

## 2023-02-14 DIAGNOSIS — C50912 Malignant neoplasm of unspecified site of left female breast: Secondary | ICD-10-CM | POA: Diagnosis not present

## 2023-02-14 DIAGNOSIS — R590 Localized enlarged lymph nodes: Secondary | ICD-10-CM | POA: Diagnosis not present

## 2023-02-14 DIAGNOSIS — Z880 Allergy status to penicillin: Secondary | ICD-10-CM | POA: Diagnosis not present

## 2023-02-14 DIAGNOSIS — D0512 Intraductal carcinoma in situ of left breast: Secondary | ICD-10-CM | POA: Diagnosis not present

## 2023-02-15 DIAGNOSIS — Z880 Allergy status to penicillin: Secondary | ICD-10-CM | POA: Diagnosis not present

## 2023-02-15 DIAGNOSIS — D0512 Intraductal carcinoma in situ of left breast: Secondary | ICD-10-CM | POA: Diagnosis not present

## 2023-02-15 DIAGNOSIS — R590 Localized enlarged lymph nodes: Secondary | ICD-10-CM | POA: Diagnosis not present

## 2023-02-15 DIAGNOSIS — C50912 Malignant neoplasm of unspecified site of left female breast: Secondary | ICD-10-CM | POA: Diagnosis not present

## 2023-02-15 DIAGNOSIS — N649 Disorder of breast, unspecified: Secondary | ICD-10-CM | POA: Diagnosis not present

## 2023-02-16 DIAGNOSIS — C50912 Malignant neoplasm of unspecified site of left female breast: Secondary | ICD-10-CM | POA: Diagnosis not present

## 2023-02-16 DIAGNOSIS — R590 Localized enlarged lymph nodes: Secondary | ICD-10-CM | POA: Diagnosis not present

## 2023-02-16 DIAGNOSIS — N649 Disorder of breast, unspecified: Secondary | ICD-10-CM | POA: Diagnosis not present

## 2023-02-16 DIAGNOSIS — Z880 Allergy status to penicillin: Secondary | ICD-10-CM | POA: Diagnosis not present

## 2023-02-17 DIAGNOSIS — N649 Disorder of breast, unspecified: Secondary | ICD-10-CM | POA: Diagnosis not present

## 2023-02-17 DIAGNOSIS — C50912 Malignant neoplasm of unspecified site of left female breast: Secondary | ICD-10-CM | POA: Diagnosis not present

## 2023-02-17 DIAGNOSIS — R590 Localized enlarged lymph nodes: Secondary | ICD-10-CM | POA: Diagnosis not present

## 2023-02-17 DIAGNOSIS — Z880 Allergy status to penicillin: Secondary | ICD-10-CM | POA: Diagnosis not present

## 2023-02-17 DIAGNOSIS — D0512 Intraductal carcinoma in situ of left breast: Secondary | ICD-10-CM | POA: Diagnosis not present

## 2023-03-25 DIAGNOSIS — Z0000 Encounter for general adult medical examination without abnormal findings: Secondary | ICD-10-CM | POA: Diagnosis not present

## 2023-03-25 DIAGNOSIS — Z13220 Encounter for screening for lipoid disorders: Secondary | ICD-10-CM | POA: Diagnosis not present

## 2023-06-27 DIAGNOSIS — D0512 Intraductal carcinoma in situ of left breast: Secondary | ICD-10-CM | POA: Diagnosis not present

## 2023-07-14 ENCOUNTER — Ambulatory Visit: Payer: BC Managed Care – PPO | Admitting: Dermatology

## 2023-08-29 DIAGNOSIS — R92333 Mammographic heterogeneous density, bilateral breasts: Secondary | ICD-10-CM | POA: Diagnosis not present

## 2023-08-29 DIAGNOSIS — D0512 Intraductal carcinoma in situ of left breast: Secondary | ICD-10-CM | POA: Diagnosis not present

## 2023-09-19 DIAGNOSIS — Z801 Family history of malignant neoplasm of trachea, bronchus and lung: Secondary | ICD-10-CM | POA: Diagnosis not present

## 2023-09-19 DIAGNOSIS — Z800 Family history of malignant neoplasm of digestive organs: Secondary | ICD-10-CM | POA: Diagnosis not present

## 2023-09-19 DIAGNOSIS — Z1722 Progesterone receptor negative status: Secondary | ICD-10-CM | POA: Diagnosis not present

## 2023-09-19 DIAGNOSIS — C50012 Malignant neoplasm of nipple and areola, left female breast: Secondary | ICD-10-CM | POA: Diagnosis not present

## 2023-09-19 DIAGNOSIS — Z170 Estrogen receptor positive status [ER+]: Secondary | ICD-10-CM | POA: Diagnosis not present

## 2023-09-19 DIAGNOSIS — Z803 Family history of malignant neoplasm of breast: Secondary | ICD-10-CM | POA: Diagnosis not present

## 2023-09-19 DIAGNOSIS — Z880 Allergy status to penicillin: Secondary | ICD-10-CM | POA: Diagnosis not present

## 2023-09-19 DIAGNOSIS — Z79811 Long term (current) use of aromatase inhibitors: Secondary | ICD-10-CM | POA: Diagnosis not present

## 2023-09-27 ENCOUNTER — Ambulatory Visit: Payer: Self-pay | Admitting: Nurse Practitioner

## 2023-09-27 NOTE — Telephone Encounter (Addendum)
  Chief Complaint: eye drainage Symptoms: tear duct drainage, itching, burning Frequency: x 3 days Pertinent Negatives: Patient denies redness, swelling, injuries Disposition: [] ED /[] Urgent Care (no appt availability in office) / [x] Appointment(In office/virtual)/ []  [place] [facility]/ [] Home Care/ [] Refused Recommended Disposition /[]  Mobile Bus/ []  Follow-up with PCP Additional Notes:  Patient c/o draining from L tear duct for about 3 days. Reports that the drainage has worsened. Denies any eyelid/periorbital swelling, scleral redness, fevers, injuries. Reports that discharge is somewhat yellow and causes eye to water. Of note, patient's PCP is no longer with practice, and has been seeing oncologist for cancer treatment. Scheduled patient per protocol on 09/28/2023 d/t sx lasting 3 days with no improvement. Patient verbalized understanding and to call back with worsening symptoms.   Reason for Disposition  [1] Very small amount of discharge AND [2] only in corner of eye  Answer Assessment - Initial Assessment Questions 1. EYE DISCHARGE: Is the discharge in one or both eyes? What color is it? How much is there? When did the discharge start?      Oozing, crusting of L eye tear duct 2. REDNESS OF SCLERA: Is the redness in one or both eyes? When did the redness start?      no 3. EYELIDS: Are the eyelids red or swollen? If Yes, ask: How much?      I didn't look, no I don't think so 4. VISION: Is there any difficulty seeing clearly?      no 5. PAIN: Is there any pain? If Yes, ask: How bad is it? (Scale 1-10; or mild, moderate, severe)    - MILD (1-3): doesn't interfere with normal activities     - MODERATE (4-7): interferes with normal activities or awakens from sleep    - SEVERE (8-10): excruciating pain, unable to do any normal activities       Just the corner of my eye burns, itches 6. CONTACT LENS: Do you wear contacts?     no 7. OTHER SYMPTOMS: Do  you have any other symptoms? (e.g., fever, runny nose, cough)     no  Protocols used: Eye - Pus or Discharge-A-AH

## 2023-09-28 ENCOUNTER — Ambulatory Visit (INDEPENDENT_AMBULATORY_CARE_PROVIDER_SITE_OTHER): Payer: BC Managed Care – PPO | Admitting: Family Medicine

## 2023-09-28 ENCOUNTER — Encounter: Payer: Self-pay | Admitting: Family Medicine

## 2023-09-28 VITALS — BP 124/80 | HR 85 | Ht 65.0 in | Wt 167.2 lb

## 2023-09-28 DIAGNOSIS — L03213 Periorbital cellulitis: Secondary | ICD-10-CM | POA: Diagnosis not present

## 2023-09-28 MED ORDER — CEFPODOXIME PROXETIL 200 MG PO TABS: 400.0000 mg | ORAL_TABLET | Freq: Two times a day (BID) | ORAL | 0 refills | Status: AC

## 2023-09-28 NOTE — Patient Instructions (Signed)
 If your symptoms do not improve after 48 hours, please send me a MyChart message and we will get the CT at that time.

## 2023-09-28 NOTE — Progress Notes (Signed)
 Acute Office Visit  Subjective:     Patient ID: Kayla Rogers, female    DOB: 1955-11-03, 68 y.o.   MRN: [id number]  Chief Complaint  Patient presents with   Eye Problem    HPI Patient is in today for left eye problems. Patient states that left eye issue began 4 days ago and has been gradually worsened. Patient complains of discharge, foreign body sensation, itching, photophobia, and tearing.  She endorses pain in the medial corner of her left eye along the tear duct.  Denies true pain but does endorse slight discomfort with eye movement.  She does wear glasses but never wears contacts.  Denies history of trauma, known foreign body, vomiting, headache, halos around lights.  She does endorse that 1-2 weeks ago, she did have a URI but recovered.  She is also taking letrozole and wonders if it may be a side effect of that.   ROS See HPI    Objective:    BP 124/80   Pulse 85   Ht 5' 5 (1.651 m)   Wt 167 lb 4 oz (75.9 kg)   LMP 12/22/2011   SpO2 98%   BMI 27.83 kg/m   Physical Exam Constitutional:      General: She is not in acute distress.    Appearance: Normal appearance. She is not ill-appearing.  HENT:     Head: Normocephalic and atraumatic.     Right Ear: Tympanic membrane, ear canal and external ear normal.     Left Ear: Tympanic membrane, ear canal and external ear normal.     Nose: Nose normal. No congestion or rhinorrhea.     Mouth/Throat:     Mouth: Mucous membranes are moist.     Pharynx: Oropharynx is clear. No oropharyngeal exudate or posterior oropharyngeal erythema.  Eyes:     General: Lids are normal.        Right eye: No discharge.        Left eye: No foreign body, discharge or hordeolum.     Extraocular Movements:     Left eye: Normal extraocular motion and no nystagmus.     Conjunctiva/sclera: Conjunctivae normal.     Left eye: Left conjunctiva is not injected. No chemosis.    Pupils: Pupils are equal, round, and reactive to light.      Comments:  Slight erythema and TTP of medial corner of left eye, tearing visible but no purulent discharge on exam  Cardiovascular:     Rate and Rhythm: Normal rate and regular rhythm.     Heart sounds: No murmur heard.    No friction rub. No gallop.  Pulmonary:     Effort: Pulmonary effort is normal. No respiratory distress.     Breath sounds: Normal breath sounds. No wheezing, rhonchi or rales.  Skin:    General: Skin is warm and dry.  Neurological:     Mental Status: She is alert and oriented to person, place, and time.       Assessment & Plan:  Preseptal cellulitis -     Cefpodoxime  Proxetil; Take 2 tablets (400 mg total) by mouth 2 (two) times daily for 7 days.  Dispense: 28 tablet; Refill: 0  Given unilateral erythema and tenderness, start oral antibiotics to cover for preseptal cellulitis.  Without true pain with EOM, suspicion low for orbital cellulitis.  Patient is allergic to penicillins, so start cefpodoxime  400 mg twice daily for 7 days.  If patient does not have improvement in 24-48 hours,  proceed with CT orbits with contrast.  Patient verbalized understanding and is agreeable to this plan.  Return if symptoms worsen or fail to improve.  Joesph DELENA Sear, PA

## 2023-09-29 ENCOUNTER — Encounter: Payer: Self-pay | Admitting: Family Medicine

## 2023-10-11 DIAGNOSIS — D0511 Intraductal carcinoma in situ of right breast: Secondary | ICD-10-CM | POA: Diagnosis not present

## 2023-12-07 DIAGNOSIS — H2513 Age-related nuclear cataract, bilateral: Secondary | ICD-10-CM | POA: Diagnosis not present

## 2024-03-30 ENCOUNTER — Ambulatory Visit
Admission: EM | Admit: 2024-03-30 | Discharge: 2024-03-30 | Disposition: A | Discharge status: Home / Self Care | Attending: Physician Assistant | Admitting: Physician Assistant

## 2024-03-30 ENCOUNTER — Other Ambulatory Visit: Payer: Self-pay

## 2024-03-30 ENCOUNTER — Ambulatory Visit: Payer: Self-pay | Admitting: *Deleted

## 2024-03-30 DIAGNOSIS — J029 Acute pharyngitis, unspecified: Secondary | ICD-10-CM | POA: Insufficient documentation

## 2024-03-30 LAB — POCT RAPID STREP A (OFFICE): Rapid Strep A Screen: NEGATIVE

## 2024-03-30 LAB — POC SOFIA SARS ANTIGEN FIA: SARS Coronavirus 2 Ag: NEGATIVE

## 2024-03-30 NOTE — Discharge Instructions (Addendum)
 You were seen today for concerns of a sore throat and voice changes.  At this time your physical exam is largely reassuring and your strep and COVID testing were negative.  We are sending a strep culture off for definitive rule out and we will keep you updated on those results once they are available especially if it is positive. For now I recommend taking Tylenol  as needed for pain management, warm tea with honey, salt water gargles, lozenges, Chloraseptic throat spray as needed.  If you start to develop more significant sinus pain, nasal congestion I recommend using Mucinex to further assist with symptoms. If you feel like you are not getting better over the next 5 to 7 days or like your symptoms are getting worse please return to urgent care or follow-up with your primary care provider.
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
## 2024-03-30 NOTE — ED Triage Notes (Signed)
 Pt presents with complaints of sore throat and loss of voice x 2 days. States her throat feels tender and it is difficult to swallow. Currently rates overall throat pain a 5/10. Cough drops used at home with no noticeable relief. Grandchildren were recently diagnosed with strep throat. Unsure of fevers at home. 98.3 F in triage room.

## 2024-03-30 NOTE — Telephone Encounter (Signed)
 FYI Only or Action Required?: FYI only for provider.  Patient was last seen in primary care on 09/04/2021 by Hanford Powell BRAVO, NP.  Called Nurse Triage reporting Sore Throat.  Symptoms began several days ago.  Interventions attempted: Rest, hydration, or home remedies.  Symptoms are: gradually worsening.  Triage Disposition: See Physician Within 24 Hours  Patient/caregiver understands and will follow disposition?: yes- UC advised  Reason for Disposition  SEVERE throat pain (e.g., excruciating)  Answer Assessment - Initial Assessment Questions 1. ONSET: When did the throat start hurting? (Hours or days ago)      2 days ago 2. SEVERITY: How bad is the sore throat? (Scale 1-10; mild, moderate or severe)     Worse today, hoarseness, hurts to swallow 3. STREP EXPOSURE: Has there been any exposure to strep within the past week? If Yes, ask: What type of contact occurred?      Grandchildren have had strep recently 4.  VIRAL SYMPTOMS: Are there any symptoms of a cold, such as a runny nose, cough, hoarse voice or red eyes?      hoarse 5. FEVER: Do you have a fever? If Yes, ask: What is your temperature, how was it measured, and when did it start?     Last night felt chills- not checked 6. PUS ON THE TONSILS: Is there pus on the tonsils in the back of your throat?     Tried to look today- redness 7. OTHER SYMPTOMS: Do you have any other symptoms? (e.g., difficulty breathing, headache, rash)     none  Protocols used: Sore Throat-A-AH  Copied from CRM #[id number]. Topic: Clinical - Red Word Triage >> Mar 30, 2024 11:02 AM Tiffini S wrote: [facility] that prompted transfer to Nurse Triage: Patient have sore throat, problems with swallowing and voice is going in a and out.

## 2024-04-02 ENCOUNTER — Ambulatory Visit (HOSPITAL_COMMUNITY): Payer: Self-pay

## 2024-04-02 LAB — CULTURE, GROUP A STREP (THRC): Special Requests: NORMAL

## 2024-04-10 DIAGNOSIS — D0511 Intraductal carcinoma in situ of right breast: Secondary | ICD-10-CM | POA: Diagnosis not present

## 2024-04-27 DIAGNOSIS — Z13220 Encounter for screening for lipoid disorders: Secondary | ICD-10-CM | POA: Diagnosis not present

## 2024-04-27 DIAGNOSIS — Z0000 Encounter for general adult medical examination without abnormal findings: Secondary | ICD-10-CM | POA: Diagnosis not present

## 2024-07-25 ENCOUNTER — Ambulatory Visit: Admitting: Radiology

## 2024-08-13 ENCOUNTER — Ambulatory Visit: Admitting: Radiology

## 2024-08-22 ENCOUNTER — Ambulatory Visit (INDEPENDENT_AMBULATORY_CARE_PROVIDER_SITE_OTHER): Admitting: Radiology

## 2024-08-22 ENCOUNTER — Encounter: Payer: Self-pay | Admitting: Radiology

## 2024-08-22 VITALS — BP 122/84 | HR 71 | Ht 65.0 in | Wt 160.0 lb

## 2024-08-22 DIAGNOSIS — Z9289 Personal history of other medical treatment: Secondary | ICD-10-CM

## 2024-08-22 DIAGNOSIS — Z01419 Encounter for gynecological examination (general) (routine) without abnormal findings: Secondary | ICD-10-CM

## 2024-08-22 DIAGNOSIS — C50012 Malignant neoplasm of nipple and areola, left female breast: Secondary | ICD-10-CM

## 2024-08-22 DIAGNOSIS — Z79818 Long term (current) use of other agents affecting estrogen receptors and estrogen levels: Secondary | ICD-10-CM

## 2024-08-22 NOTE — Progress Notes (Signed)
 "  Kayla Rogers [birth year] [id number]   History: Postmenopausal 68 y.o. presents for annual exam. S/p left partial mastectomy, removal of bilateral implants and breast reduction for Pagets carcinoma of the left nipple. On letrozole x 5-7 years, managed by [facility].  Risk Factors for Medicare Patients >/= 5 sexual partners in a lifetime: No First intercourse <64 years of age: No H/O STD at any age: No Abnormal pap smear, < 3 negative paps within the last 7 years: No DES exposure (women born between [phone]): No Patient is on post breast cancer medication like Femara or, if medication like this is not needed, 5 years post breast cancer diagnosis: Yes   Gynecologic History Postmenopausal Last Pap: 2023. Results were: normal Last mammogram: 09/01/23 DEXA:2023 HRT use: no  Obstetric History OB History  Gravida Para Term Preterm AB Living  6 4   2 4   SAB IAB Ectopic Multiple Live Births  2    4    # Outcome Date GA Lbr Len/2nd Weight Sex Type Anes PTL Lv  6 SAB           5 SAB           4 Para           3 Para           2 Para           1 Para             Obstetric Comments  Has 8 children (4 are adopted).     The following portions of the patient's history were reviewed and updated as appropriate: allergies, current medications, past family history, past medical history, past social history, past surgical history, and problem list.  Review of Systems Pertinent items noted in HPI and remainder of comprehensive ROS otherwise negative.  Past medical history, past surgical history, family history and social history were all reviewed and documented in the EPIC chart.  Exam:  Vitals:   08/22/24 0914  BP: 122/84  Pulse: 71  SpO2: 98%  Weight: 160 lb (72.6 kg)  Height: 5' 5 (1.651 m)   Body mass index is 26.63 kg/m.  General appearance:  Normal Thyroid :  Symmetrical, normal in size, without palpable masses or nodularity. Respiratory  Auscultation:  Clear without wheezing or  rhonchi Cardiovascular  Auscultation:  Regular rate, without rubs, murmurs or gallops  Edema/varicosities:  Not grossly evident Abdominal  Soft,nontender, without masses, guarding or rebound.  Liver/spleen:  No organomegaly noted  Hernia:  None appreciated  Skin  Inspection:  Grossly normal Breasts: Examined lying and sitting. Surgical scars from implant removal/reduction and lumpectomy.  Right: Without masses, retractions, nipple discharge or axillary adenopathy.   Left: Without masses, retractions, nipple discharge or axillary adenopathy. Genitourinary   Inguinal/mons:  Normal without inguinal adenopathy  External genitalia:  Normal appearing vulva with no masses, tenderness, or lesions  BUS/Urethra/Skene's glands:  Normal  Vagina:  Normal appearing with normal color and discharge, no lesions. Atrophy: mild   Cervix:  Normal appearing without discharge or lesions  Uterus:  Normal in size, shape and contour.  Midline and mobile, nontender  Adnexa/parametria:     Rt: Normal in size, without masses or tenderness.   Lt: Normal in size, without masses or tenderness.  Anus and perineum: Normal    Darice Hoit, CMA present for exam  Assessment/Plan:   1. Well woman exam with routine gynecological exam Up to date on screenings  2. Paget's disease of left  female breast [facility]) Managed by [facility], currently on letrozole. Followed every 6 months    Return in 1 year for annual or sooner prn.  Mariann Palo B WHNP-BC, 9:18 AM 08/22/2024  "
